# Patient Record
Sex: Female | Born: 1945 | Race: White | Hispanic: No | Marital: Married | Smoking: Never smoker
Health system: Southern US, Community
[De-identification: ages and names within clinical notes are randomized; demographics above are authoritative.]

## PROBLEM LIST (undated history)

## (undated) DIAGNOSIS — J302 Other seasonal allergic rhinitis: Secondary | ICD-10-CM

## (undated) DIAGNOSIS — E785 Hyperlipidemia, unspecified: Secondary | ICD-10-CM

## (undated) HISTORY — DX: Hyperlipidemia, unspecified: E78.5

## (undated) HISTORY — PX: TUBAL LIGATION: SHX77

## (undated) HISTORY — DX: Other seasonal allergic rhinitis: J30.2

---

## 1999-10-30 HISTORY — PX: CHOLECYSTECTOMY: SHX55

## 1999-12-27 ENCOUNTER — Encounter: Payer: Self-pay | Admitting: General Surgery

## 1999-12-29 ENCOUNTER — Observation Stay (HOSPITAL_COMMUNITY): Admission: RE | Admit: 1999-12-29 | Discharge: 1999-12-30 | Payer: Self-pay | Admitting: General Surgery

## 2011-11-30 DIAGNOSIS — R110 Nausea: Secondary | ICD-10-CM | POA: Diagnosis not present

## 2011-11-30 DIAGNOSIS — Z7982 Long term (current) use of aspirin: Secondary | ICD-10-CM | POA: Diagnosis not present

## 2011-11-30 DIAGNOSIS — Z23 Encounter for immunization: Secondary | ICD-10-CM | POA: Diagnosis not present

## 2011-11-30 DIAGNOSIS — K5730 Diverticulosis of large intestine without perforation or abscess without bleeding: Secondary | ICD-10-CM | POA: Diagnosis not present

## 2011-11-30 DIAGNOSIS — K56609 Unspecified intestinal obstruction, unspecified as to partial versus complete obstruction: Secondary | ICD-10-CM | POA: Diagnosis not present

## 2011-11-30 DIAGNOSIS — E7800 Pure hypercholesterolemia, unspecified: Secondary | ICD-10-CM | POA: Diagnosis not present

## 2011-11-30 DIAGNOSIS — K560 Paralytic ileus: Secondary | ICD-10-CM | POA: Diagnosis not present

## 2011-11-30 DIAGNOSIS — R1013 Epigastric pain: Secondary | ICD-10-CM | POA: Diagnosis not present

## 2011-11-30 DIAGNOSIS — I10 Essential (primary) hypertension: Secondary | ICD-10-CM | POA: Diagnosis not present

## 2011-11-30 DIAGNOSIS — K449 Diaphragmatic hernia without obstruction or gangrene: Secondary | ICD-10-CM | POA: Diagnosis not present

## 2011-12-01 DIAGNOSIS — K5730 Diverticulosis of large intestine without perforation or abscess without bleeding: Secondary | ICD-10-CM | POA: Diagnosis present

## 2011-12-01 DIAGNOSIS — Z7982 Long term (current) use of aspirin: Secondary | ICD-10-CM | POA: Diagnosis not present

## 2011-12-01 DIAGNOSIS — K560 Paralytic ileus: Secondary | ICD-10-CM | POA: Diagnosis not present

## 2011-12-01 DIAGNOSIS — Z23 Encounter for immunization: Secondary | ICD-10-CM | POA: Diagnosis not present

## 2011-12-01 DIAGNOSIS — K56609 Unspecified intestinal obstruction, unspecified as to partial versus complete obstruction: Secondary | ICD-10-CM | POA: Diagnosis not present

## 2011-12-01 DIAGNOSIS — R110 Nausea: Secondary | ICD-10-CM | POA: Diagnosis not present

## 2011-12-01 DIAGNOSIS — I10 Essential (primary) hypertension: Secondary | ICD-10-CM | POA: Diagnosis present

## 2011-12-01 DIAGNOSIS — K449 Diaphragmatic hernia without obstruction or gangrene: Secondary | ICD-10-CM | POA: Diagnosis present

## 2011-12-01 DIAGNOSIS — E7800 Pure hypercholesterolemia, unspecified: Secondary | ICD-10-CM | POA: Diagnosis present

## 2011-12-01 DIAGNOSIS — R1013 Epigastric pain: Secondary | ICD-10-CM | POA: Diagnosis not present

## 2012-01-23 DIAGNOSIS — Z0000 Encounter for general adult medical examination without abnormal findings: Secondary | ICD-10-CM | POA: Diagnosis not present

## 2012-01-23 DIAGNOSIS — Z01419 Encounter for gynecological examination (general) (routine) without abnormal findings: Secondary | ICD-10-CM | POA: Diagnosis not present

## 2012-02-05 DIAGNOSIS — Z1231 Encounter for screening mammogram for malignant neoplasm of breast: Secondary | ICD-10-CM | POA: Diagnosis not present

## 2012-03-06 DIAGNOSIS — G479 Sleep disorder, unspecified: Secondary | ICD-10-CM | POA: Diagnosis not present

## 2012-03-06 DIAGNOSIS — M1710 Unilateral primary osteoarthritis, unspecified knee: Secondary | ICD-10-CM | POA: Diagnosis not present

## 2012-03-18 DIAGNOSIS — E782 Mixed hyperlipidemia: Secondary | ICD-10-CM | POA: Diagnosis not present

## 2012-03-18 DIAGNOSIS — I10 Essential (primary) hypertension: Secondary | ICD-10-CM | POA: Diagnosis not present

## 2012-05-12 DIAGNOSIS — J019 Acute sinusitis, unspecified: Secondary | ICD-10-CM | POA: Diagnosis not present

## 2012-09-22 DIAGNOSIS — E559 Vitamin D deficiency, unspecified: Secondary | ICD-10-CM | POA: Diagnosis not present

## 2012-09-22 DIAGNOSIS — I10 Essential (primary) hypertension: Secondary | ICD-10-CM | POA: Diagnosis not present

## 2012-09-22 DIAGNOSIS — E782 Mixed hyperlipidemia: Secondary | ICD-10-CM | POA: Diagnosis not present

## 2012-10-08 DIAGNOSIS — E559 Vitamin D deficiency, unspecified: Secondary | ICD-10-CM | POA: Diagnosis not present

## 2012-10-08 DIAGNOSIS — E782 Mixed hyperlipidemia: Secondary | ICD-10-CM | POA: Diagnosis not present

## 2012-10-27 DIAGNOSIS — D1801 Hemangioma of skin and subcutaneous tissue: Secondary | ICD-10-CM | POA: Diagnosis not present

## 2012-10-27 DIAGNOSIS — L820 Inflamed seborrheic keratosis: Secondary | ICD-10-CM | POA: Diagnosis not present

## 2012-10-27 DIAGNOSIS — L821 Other seborrheic keratosis: Secondary | ICD-10-CM | POA: Diagnosis not present

## 2012-10-27 DIAGNOSIS — L570 Actinic keratosis: Secondary | ICD-10-CM | POA: Diagnosis not present

## 2012-11-06 DIAGNOSIS — R0989 Other specified symptoms and signs involving the circulatory and respiratory systems: Secondary | ICD-10-CM | POA: Diagnosis not present

## 2012-11-06 DIAGNOSIS — J069 Acute upper respiratory infection, unspecified: Secondary | ICD-10-CM | POA: Diagnosis not present

## 2012-11-06 DIAGNOSIS — J209 Acute bronchitis, unspecified: Secondary | ICD-10-CM | POA: Diagnosis not present

## 2012-12-15 DIAGNOSIS — H43399 Other vitreous opacities, unspecified eye: Secondary | ICD-10-CM | POA: Diagnosis not present

## 2012-12-15 DIAGNOSIS — H52229 Regular astigmatism, unspecified eye: Secondary | ICD-10-CM | POA: Diagnosis not present

## 2012-12-15 DIAGNOSIS — H2589 Other age-related cataract: Secondary | ICD-10-CM | POA: Diagnosis not present

## 2012-12-15 DIAGNOSIS — H5200 Hypermetropia, unspecified eye: Secondary | ICD-10-CM | POA: Diagnosis not present

## 2013-03-17 DIAGNOSIS — K219 Gastro-esophageal reflux disease without esophagitis: Secondary | ICD-10-CM | POA: Diagnosis not present

## 2013-03-17 DIAGNOSIS — I10 Essential (primary) hypertension: Secondary | ICD-10-CM | POA: Diagnosis not present

## 2013-03-17 DIAGNOSIS — E782 Mixed hyperlipidemia: Secondary | ICD-10-CM | POA: Diagnosis not present

## 2013-03-28 DIAGNOSIS — Z7982 Long term (current) use of aspirin: Secondary | ICD-10-CM | POA: Diagnosis not present

## 2013-03-28 DIAGNOSIS — R1084 Generalized abdominal pain: Secondary | ICD-10-CM | POA: Diagnosis not present

## 2013-03-28 DIAGNOSIS — I10 Essential (primary) hypertension: Secondary | ICD-10-CM | POA: Diagnosis not present

## 2013-03-28 DIAGNOSIS — R05 Cough: Secondary | ICD-10-CM | POA: Diagnosis not present

## 2013-03-28 DIAGNOSIS — R112 Nausea with vomiting, unspecified: Secondary | ICD-10-CM | POA: Diagnosis not present

## 2013-03-28 DIAGNOSIS — K56609 Unspecified intestinal obstruction, unspecified as to partial versus complete obstruction: Secondary | ICD-10-CM | POA: Diagnosis not present

## 2013-03-28 DIAGNOSIS — E7800 Pure hypercholesterolemia, unspecified: Secondary | ICD-10-CM | POA: Diagnosis not present

## 2013-03-28 DIAGNOSIS — K449 Diaphragmatic hernia without obstruction or gangrene: Secondary | ICD-10-CM | POA: Diagnosis not present

## 2013-03-28 DIAGNOSIS — R059 Cough, unspecified: Secondary | ICD-10-CM | POA: Diagnosis not present

## 2013-03-28 DIAGNOSIS — Z79899 Other long term (current) drug therapy: Secondary | ICD-10-CM | POA: Diagnosis not present

## 2013-04-08 DIAGNOSIS — J0100 Acute maxillary sinusitis, unspecified: Secondary | ICD-10-CM | POA: Diagnosis not present

## 2013-04-08 DIAGNOSIS — J209 Acute bronchitis, unspecified: Secondary | ICD-10-CM | POA: Diagnosis not present

## 2013-04-14 DIAGNOSIS — K56609 Unspecified intestinal obstruction, unspecified as to partial versus complete obstruction: Secondary | ICD-10-CM | POA: Diagnosis not present

## 2013-04-15 DIAGNOSIS — Z1211 Encounter for screening for malignant neoplasm of colon: Secondary | ICD-10-CM | POA: Diagnosis not present

## 2013-04-15 DIAGNOSIS — Z1239 Encounter for other screening for malignant neoplasm of breast: Secondary | ICD-10-CM | POA: Diagnosis not present

## 2013-04-15 DIAGNOSIS — Z1331 Encounter for screening for depression: Secondary | ICD-10-CM | POA: Diagnosis not present

## 2013-04-15 DIAGNOSIS — Z0000 Encounter for general adult medical examination without abnormal findings: Secondary | ICD-10-CM | POA: Diagnosis not present

## 2013-04-16 DIAGNOSIS — K219 Gastro-esophageal reflux disease without esophagitis: Secondary | ICD-10-CM | POA: Diagnosis not present

## 2013-04-16 DIAGNOSIS — R112 Nausea with vomiting, unspecified: Secondary | ICD-10-CM | POA: Diagnosis not present

## 2013-04-23 DIAGNOSIS — Z1231 Encounter for screening mammogram for malignant neoplasm of breast: Secondary | ICD-10-CM | POA: Diagnosis not present

## 2013-05-04 DIAGNOSIS — N816 Rectocele: Secondary | ICD-10-CM | POA: Diagnosis not present

## 2013-06-08 DIAGNOSIS — N816 Rectocele: Secondary | ICD-10-CM | POA: Diagnosis not present

## 2013-08-07 DIAGNOSIS — Z23 Encounter for immunization: Secondary | ICD-10-CM | POA: Diagnosis not present

## 2013-09-23 DIAGNOSIS — J069 Acute upper respiratory infection, unspecified: Secondary | ICD-10-CM | POA: Diagnosis not present

## 2013-10-14 DIAGNOSIS — J209 Acute bronchitis, unspecified: Secondary | ICD-10-CM | POA: Diagnosis not present

## 2013-10-14 DIAGNOSIS — J040 Acute laryngitis: Secondary | ICD-10-CM | POA: Diagnosis not present

## 2013-10-16 ENCOUNTER — Encounter: Payer: Self-pay | Admitting: Internal Medicine

## 2013-10-16 ENCOUNTER — Ambulatory Visit (INDEPENDENT_AMBULATORY_CARE_PROVIDER_SITE_OTHER): Payer: Medicare Other | Admitting: Internal Medicine

## 2013-10-16 VITALS — BP 94/60 | HR 90 | Temp 98.5°F | Ht 65.0 in | Wt 170.8 lb

## 2013-10-16 DIAGNOSIS — J45909 Unspecified asthma, uncomplicated: Secondary | ICD-10-CM

## 2013-10-16 DIAGNOSIS — K219 Gastro-esophageal reflux disease without esophagitis: Secondary | ICD-10-CM | POA: Diagnosis not present

## 2013-10-16 MED ORDER — TRAMADOL HCL 50 MG PO TABS: 50.0000 mg | ORAL_TABLET | Freq: Four times a day (QID) | ORAL | Status: DC | PRN

## 2013-10-16 MED ORDER — BUDESONIDE-FORMOTEROL FUMARATE 80-4.5 MCG/ACT IN AERO: INHALATION_SPRAY | RESPIRATORY_TRACT | Status: DC

## 2013-10-16 NOTE — Patient Instructions (Addendum)
symbicort 80 Take 2 puffs first thing in am and then another 2 puffs about 12 hours later  Work on inhaler technique:  relax and gently blow all the way out then take a nice smooth deep breath back in, triggering the inhaler at same time you start breathing in.  Hold for up to 5 seconds if you can.  Rinse and gargle with water when done      Finish zpak  For cough mucinex dm up to 1200 mg every 12 hours and if not controlled ok to take tramadol 50 mg one every 4 but may make you sleepy  Nexium 40 mg   Take 30-60 min before first meal of the day and Pepcid 20 mg one bedtime until return to office - this is the best way to tell whether stomach acid is contributing to your problem.    GERD (REFLUX)  is an extremely common cause of respiratory symptoms, many times with no significant heartburn at all.    It can be treated with medication, but also with lifestyle changes including avoidance of late meals, excessive alcohol, smoking cessation, and avoid fatty foods, chocolate, peppermint, colas, red wine, and acidic juices such as orange juice.  NO MINT OR MENTHOL PRODUCTS SO NO COUGH DROPS  USE SUGARLESS CANDY INSTEAD (jolley ranchers or Stover's)  NO OIL BASED VITAMINS - use powdered substitutes.    Please schedule a follow up office visit in 4 weeks, sooner if needed

## 2013-10-16 NOTE — Progress Notes (Signed)
   Subjective:    Patient ID: Desiree Greene, female    DOB: 03/06/1946  MRN: [id number]  HPI  26 yowf never smoker with chronic reflux symptoms onset age mid 39s and tendency to sinus problems problems in spring >  fall with sneezing and nasal congestion onset late 4s rx with otc's like benadryl then new problem with cough Jan 2014 assoc with  sob rx pred/abx > resolved  then same problem later summer and then again Nov 2014 when had the worst episode and so self referred to pulmonary 10/16/2013 for refractory cough > sob recurrent in 09/2013    10/16/2013 1st [facility] office visit/ Brena Windsor cc recurrent cough x 1 year with 4th episode starting 12/15 > seen first care 12/17 dx bronchitis rx zpak, no prednisone for the first time, has inhaler from prev flare not using.  Mucus is always white. Pattern of flare has been increasing freq, intensity,but still 100% resolved in between episodes s need for rx. Does not some worse HB with coughing.  Mostly sob when coughing but even when not coughing has some doe  No obvious day to day or daytime variabilty or assoc   cp or chest tightness, subjective wheeze overt sinus or hb symptoms. No unusual exp hx or h/o childhood pna/ asthma or knowledge of premature birth.  Sleeping ok without nocturnal  or early am exacerbation  of respiratory  c/o's or need for noct saba. Also denies any obvious fluctuation of symptoms with weather or environmental changes or other aggravating or alleviating factors except as outlined above   Current Medications, Allergies, Complete Past Medical History, Past Surgical History, Family History, and Social History were reviewed in [employer] record.   .         Review of Systems  Constitutional: Negative for fever, chills and unexpected weight change.  HENT: Positive for congestion and sneezing. Negative for dental problem, ear pain, nosebleeds, postnasal drip, rhinorrhea, sinus pressure, sore  throat, trouble swallowing and voice change.   Eyes: Negative for visual disturbance.  Respiratory: Positive for cough. Negative for choking and shortness of breath.   Cardiovascular: Negative for chest pain and leg swelling.  Gastrointestinal: Negative for vomiting, abdominal pain and diarrhea.  Genitourinary: Negative for difficulty urinating.       Heartburn Indigestion  Musculoskeletal: Positive for arthralgias.  Skin: Negative for rash.  Neurological: Negative for tremors, syncope and headaches.  Hematological: Does not bruise/bleed easily.       Objective:   Physical Exam amb wf nad Wt Readings from Last 3 Encounters:  10/16/13 170 lb 12.8 oz (77.474 kg)      HEENT: nl dentition, turbinates, and orophanx. Nl external ear canals without cough reflex   NECK :  without JVD/Nodes/TM/ nl carotid upstrokes bilaterally   LUNGS: no acc muscle use,  Exp > insp prominent rhonchi bilaterally with exp cough   CV:  RRR  no s3 or murmur or increase in P2, no edema   ABD:  soft and nontender with nl excursion in the supine position. No bruits or organomegaly, bowel sounds nl  MS:  warm without deformities, calf tenderness, cyanosis or clubbing  SKIN: warm and dry without lesions    NEURO:  alert, approp, no deficits         Assessment & Plan:

## 2013-10-17 DIAGNOSIS — K219 Gastro-esophageal reflux disease without esophagitis: Secondary | ICD-10-CM | POA: Insufficient documentation

## 2013-10-17 NOTE — Assessment & Plan Note (Signed)
Explained natural history of uri (which may be the problem here though the frequency of her cough exac suggests this may not always be the case)  and why it's necessary in patients at risk to treat GERD aggressively  at least  short term   to reduce risk of evolving cyclical cough initially  triggered by epithelial injury and a heightened sensitivty to the effects of any upper airway irritants,  most importantly acid - related.  That is, the more sensitive the epithelium damaged for virus, the more the cough, the more the secondary reflux (especially in those prone to reflux) the more the irritation of the sensitive mucosa and so on in a cyclical pattern.

## 2013-10-17 NOTE — Assessment & Plan Note (Addendum)
DDX of  difficult airways managment all start with A and  include Adherence, Ace Inhibitors, Acid Reflux, Active Sinus Disease, Alpha 1 Antitripsin deficiency, Anxiety masquerading as Airways dz,  ABPA,  allergy(esp in young), Aspiration (esp in elderly), Adverse effects of DPI,  Active smokers, plus two Bs  = Bronchiectasis and Beta blocker use..and one C= CHF  Adherence is always the initial "prime suspect" and is a multilayered concern that requires a "trust but verify" approach in every patient - starting with knowing how to use medications, especially inhalers, correctly, keeping up with refills and understanding the fundamental difference between maintenance and prns vs those medications only taken for a very short course and then stopped and not refilled.  - The proper method of use, as well as anticipated side effects, of a metered-dose inhaler are discussed and demonstrated to the patient. Improved effectiveness after extensive coaching during this visit to a level of approximately  50% so try symbicort 80 2 bid plus depomedrol x 1  ? Acid (or non-acid) GERD > always difficult to exclude as up to 75% of pts in some series report no assoc GI/ Heartburn symptoms and those that do (like this pt) have close to 100% incidence of documented gerd related asthma > rec max (24h)  acid suppression and diet restrictions/ reviewed and instructions given in writing.  ? Active sinus dz > sinus ct if not responding.

## 2013-11-04 DIAGNOSIS — L578 Other skin changes due to chronic exposure to nonionizing radiation: Secondary | ICD-10-CM | POA: Diagnosis not present

## 2013-11-04 DIAGNOSIS — L821 Other seborrheic keratosis: Secondary | ICD-10-CM | POA: Diagnosis not present

## 2013-11-04 DIAGNOSIS — L820 Inflamed seborrheic keratosis: Secondary | ICD-10-CM | POA: Diagnosis not present

## 2013-11-04 DIAGNOSIS — D1801 Hemangioma of skin and subcutaneous tissue: Secondary | ICD-10-CM | POA: Diagnosis not present

## 2013-11-19 ENCOUNTER — Other Ambulatory Visit (INDEPENDENT_AMBULATORY_CARE_PROVIDER_SITE_OTHER): Payer: Medicare Other

## 2013-11-19 ENCOUNTER — Ambulatory Visit (INDEPENDENT_AMBULATORY_CARE_PROVIDER_SITE_OTHER): Payer: Medicare Other | Admitting: Internal Medicine

## 2013-11-19 ENCOUNTER — Encounter: Payer: Self-pay | Admitting: Internal Medicine

## 2013-11-19 VITALS — BP 106/66 | HR 65 | Temp 97.6°F | Ht 66.0 in | Wt 175.0 lb

## 2013-11-19 DIAGNOSIS — K219 Gastro-esophageal reflux disease without esophagitis: Secondary | ICD-10-CM | POA: Diagnosis not present

## 2013-11-19 DIAGNOSIS — J45909 Unspecified asthma, uncomplicated: Secondary | ICD-10-CM

## 2013-11-19 LAB — CBC WITH DIFFERENTIAL/PLATELET
BASOS PCT: 0.5 % (ref 0.0–3.0)
Basophils Absolute: 0 10*3/uL (ref 0.0–0.1)
Eosinophils Absolute: 0.5 10*3/uL (ref 0.0–0.7)
Eosinophils Relative: 5.3 % — ABNORMAL HIGH (ref 0.0–5.0)
HEMATOCRIT: 38.7 % (ref 36.0–46.0)
Hemoglobin: 13.3 g/dL (ref 12.0–15.0)
LYMPHS ABS: 2.9 10*3/uL (ref 0.7–4.0)
Lymphocytes Relative: 28.5 % (ref 12.0–46.0)
MCHC: 34.4 g/dL (ref 30.0–36.0)
MCV: 92.4 fl (ref 78.0–100.0)
MONO ABS: 0.4 10*3/uL (ref 0.1–1.0)
MONOS PCT: 4.2 % (ref 3.0–12.0)
Neutro Abs: 6.2 10*3/uL (ref 1.4–7.7)
Neutrophils Relative %: 61.5 % (ref 43.0–77.0)
PLATELETS: 292 10*3/uL (ref 150.0–400.0)
RBC: 4.18 Mil/uL (ref 3.87–5.11)
RDW: 12.8 % (ref 11.5–14.6)
WBC: 10.2 10*3/uL (ref 4.5–10.5)

## 2013-11-19 NOTE — Patient Instructions (Addendum)
Change the symbicort 160 1- 2 every 12 if breathing problems  Please see patient coordinator before you leave today  to schedule sinus ct  Please remember to go to the lab   department downstairs for your tests - we will call you with the results when they are available.  Please schedule a follow up office visit in 4 weeks, sooner if needed

## 2013-11-19 NOTE — Progress Notes (Signed)
Subjective:    Patient ID: Desiree Greene, female    DOB: [birth year]  MRN: [id number]  Brief patient profile:  78 yowf never smoker with chronic reflux symptoms onset age mid 19s and tendency to sinus problems problems in spring >  fall with sneezing and nasal congestion onset late 17s rx with otc's like benadryl then new problem with cough Jan 2014 assoc with  sob rx pred/abx > resolved  then same problem later summer and then again Nov 2014 when had the worst episode and so self referred to pulmonary 10/16/2013 for refractory cough > sob recurrent in 09/2013    History of Present Illness  10/16/2013 1st [facility] office visit/ Crystall Donaldson cc recurrent cough x 1 year with 4th episode starting 12/15 > seen [facility] 12/17 dx bronchitis rx zpak, no prednisone for the first time, has inhaler from prev flare not using.  Mucus is always white. Pattern of flare has been increasing freq, intensity,but still 100% resolved in between episodes s need for rx. Does not some worse HB with coughing.  Mostly sob when coughing but even when not coughing has some doe rec symbicort 80 Take 2 puffs first thing in am and then another 2 puffs about 12 hours later Work on inhaler technique:  Finish zpak For cough mucinex dm up to 1200 mg every 12 hours and if not controlled ok to take tramadol 50 mg one every 4 but may make you sleepy Nexium 40 mg   Take 30-60 min before first meal of the day and Pepcid 20 mg one bedtime until return to office - this is the best way to tell whether stomach acid is contributing to your problem.   GERD  Diet .      11/19/2013 f/u ov/Estoria Geary re: chronic cough  Chief Complaint  Patient presents with  . Follow-up    Pt has no breathing complaints at this time.      She can definitely tell when she misses a dose of symbicort but other wise doing well.  Min hoarsness since starting symbicort  No obvious day to day or daytime variabilty or assoc chronic cough or cp or chest tightness,  subjective wheeze overt sinus or hb symptoms. No unusual exp hx or h/o childhood pna/ asthma or knowledge of premature birth.  Sleeping ok without nocturnal  or early am exacerbation  of respiratory  c/o's or need for noct saba. Also denies any obvious fluctuation of symptoms with weather or environmental changes or other aggravating or alleviating factors except as outlined above   Current Medications, Allergies, Complete Past Medical History, Past Surgical History, Family History, and Social History were reviewed in [employer] record.  ROS  The following are not active complaints unless bolded sore throat, dysphagia, dental problems, itching, sneezing,  nasal congestion or excess/ purulent secretions, ear ache,   fever, chills, sweats, unintended wt loss, pleuritic or exertional cp, hemoptysis,  orthopnea pnd or leg swelling, presyncope, palpitations, heartburn, abdominal pain, anorexia, nausea, vomiting, diarrhea  or change in bowel or urinary habits, change in stools or urine, dysuria,hematuria,  rash, arthralgias, visual complaints, headache, numbness weakness or ataxia or problems with walking or coordination,  change in mood/affect or memory.        .              Objective:   Physical Exam   amb wf nad  Wt Readings from Last 3 Encounters:  11/19/13 175 lb (79.379 kg)  10/16/13 170 lb 12.8 oz (77.474 kg)         HEENT: nl dentition, turbinates, and orophanx. Nl external ear canals without cough reflex   NECK :  without JVD/Nodes/TM/ nl carotid upstrokes bilaterally   LUNGS: no acc muscle use,  Clear now to a and P   CV:  RRR  no s3 or murmur or increase in P2, no edema   ABD:  soft and nontender with nl excursion in the supine position. No bruits or organomegaly, bowel sounds nl  MS:  warm without deformities, calf tenderness, cyanosis or clubbing  SKIN: warm and dry without lesions    NEURO:  alert, approp, no deficits          Assessment & Plan:

## 2013-11-20 ENCOUNTER — Encounter: Payer: Self-pay | Admitting: Internal Medicine

## 2013-11-20 LAB — ALLERGY PROFILE REGION II-DC, DE, MD, ~~LOC~~, VA
ALLERGEN, D PTERNOYSSINUS, D1: 9.93 kU/L — AB
Alternaria Alternata: <0.1 kU/L
Aspergillus fumigatus, m3: <0.1 kU/L
BERMUDA GRASS: 1.46 kU/L — AB
BOX ELDER: 0.34 kU/L — AB
COCKROACH: 0.81 kU/L — AB
COMMON RAGWEED: 0.47 kU/L — AB
Cat Dander: <0.1 kU/L
D. farinae: 17.9 kU/L — ABNORMAL HIGH
DOG DANDER: 0.28 kU/L — AB
Elm IgE: 0.38 kU/L — ABNORMAL HIGH
IgE (Immunoglobulin E), Serum: 794.5 IU/mL — ABNORMAL HIGH (ref 0.0–180.0)
Johnson Grass: 0.67 kU/L — ABNORMAL HIGH
LAMB'S QUARTERS CLASS: 0.74 kU/L — AB
Meadow Grass: 0.78 kU/L — ABNORMAL HIGH
Oak: 0.54 kU/L — ABNORMAL HIGH
PECAN/HICKORY TREE IGE: 0.22 kU/L — AB

## 2013-11-20 NOTE — Assessment & Plan Note (Signed)
Adequate control on present rx, reviewed > no change in rx needed   

## 2013-11-20 NOTE — Assessment & Plan Note (Addendum)
-    hfa 50% 10/16/2013   - Allergy profile 11/19/2013 >  Eos 5.3% ,  IG=gE 795  The proper method of use, as well as anticipated side effects, of a metered-dose inhaler are discussed and demonstrated to the patient. Improved effectiveness after extensive coaching during this visit to a level of approximately  75%  Since we don't have any samples of the 80 and she's mastering hfa better ok to use the 160 1-2 bid and return in one month to regroup re issue of allergic component    Each maintenance medication was reviewed in detail including most importantly the difference between maintenance and as needed and under what circumstances the prns are to be used.  Please see instructions for details which were reviewed in writing and the patient given a copy.

## 2013-11-21 ENCOUNTER — Encounter: Payer: Self-pay | Admitting: Internal Medicine

## 2013-11-23 NOTE — Progress Notes (Signed)
Quick Note:  Spoke with pt and notified of results per Dr. Wert. Pt verbalized understanding and denied any questions.  ______ 

## 2013-11-25 ENCOUNTER — Ambulatory Visit (INDEPENDENT_AMBULATORY_CARE_PROVIDER_SITE_OTHER)
Admission: RE | Admit: 2013-11-25 | Discharge: 2013-11-25 | Disposition: A | Discharge status: Home / Self Care | Payer: Medicare Other | Source: Ambulatory Visit | Attending: Internal Medicine | Admitting: Internal Medicine

## 2013-11-25 ENCOUNTER — Encounter: Payer: Self-pay | Admitting: Internal Medicine

## 2013-11-25 DIAGNOSIS — J45909 Unspecified asthma, uncomplicated: Secondary | ICD-10-CM | POA: Diagnosis not present

## 2013-11-25 DIAGNOSIS — J3489 Other specified disorders of nose and nasal sinuses: Secondary | ICD-10-CM | POA: Diagnosis not present

## 2013-11-25 NOTE — Progress Notes (Signed)
Quick Note:  LMTCB ______ 

## 2013-11-26 ENCOUNTER — Telehealth: Payer: Self-pay | Admitting: Internal Medicine

## 2013-11-26 MED ORDER — AMOXICILLIN-POT CLAVULANATE 875-125 MG PO TABS: 1.0000 | ORAL_TABLET | Freq: Two times a day (BID) | ORAL | Status: DC

## 2013-11-26 NOTE — Telephone Encounter (Signed)
Call patient : Study is c/w acute and chronic sinusitis may be contributing to symptoms and only way to tell = Augmentin 875 mg take one pill twice daily X 20 days - take at breakfast and supper with large glass of water. It would help reduce the usual side effects (diarrhea and yeast infections) if you ate cultured yogurt at lunch. Then ov at 21 d   I spoke with patient about results and she verbalized understanding and had no questions. RX sent. Nothing further needed

## 2013-12-18 ENCOUNTER — Ambulatory Visit: Payer: Medicare Other | Admitting: Internal Medicine

## 2013-12-22 ENCOUNTER — Ambulatory Visit: Payer: Medicare Other | Admitting: Internal Medicine

## 2013-12-29 ENCOUNTER — Ambulatory Visit (INDEPENDENT_AMBULATORY_CARE_PROVIDER_SITE_OTHER): Payer: Medicare Other | Admitting: Internal Medicine

## 2013-12-29 ENCOUNTER — Encounter: Payer: Self-pay | Admitting: Internal Medicine

## 2013-12-29 VITALS — BP 138/80 | HR 71 | Temp 97.5°F | Ht 66.0 in | Wt 181.6 lb

## 2013-12-29 DIAGNOSIS — J019 Acute sinusitis, unspecified: Secondary | ICD-10-CM | POA: Diagnosis not present

## 2013-12-29 DIAGNOSIS — J309 Allergic rhinitis, unspecified: Secondary | ICD-10-CM | POA: Diagnosis not present

## 2013-12-29 DIAGNOSIS — J45909 Unspecified asthma, uncomplicated: Secondary | ICD-10-CM

## 2013-12-29 DIAGNOSIS — J329 Chronic sinusitis, unspecified: Secondary | ICD-10-CM | POA: Diagnosis not present

## 2013-12-29 DIAGNOSIS — J342 Deviated nasal septum: Secondary | ICD-10-CM | POA: Diagnosis not present

## 2013-12-29 MED ORDER — PREDNISONE 10 MG PO TABS: ORAL_TABLET | ORAL | Status: DC

## 2013-12-29 MED ORDER — MONTELUKAST SODIUM 10 MG PO TABS: ORAL_TABLET | ORAL | Status: DC

## 2013-12-29 MED ORDER — FAMOTIDINE 20 MG PO TABS: 20.0000 mg | ORAL_TABLET | Freq: Every day | ORAL | Status: DC

## 2013-12-29 MED ORDER — ESOMEPRAZOLE MAGNESIUM 40 MG PO CPDR: DELAYED_RELEASE_CAPSULE | ORAL | Status: DC

## 2013-12-29 MED ORDER — AMOXICILLIN-POT CLAVULANATE 875-125 MG PO TABS: 1.0000 | ORAL_TABLET | Freq: Two times a day (BID) | ORAL | Status: DC

## 2013-12-29 NOTE — Patient Instructions (Addendum)
Montelukast 10 mg one each pm   Please see patient coordinator before you leave today  to schedule ENT eval with Dr Melina Modena and  Allergy Kozlow   Stop zyrtec  For cough mucinex dm up to 1200 mg every 12 hours if needed   For breathing  symbicort 160 up to 2 puffs every 12 hours if needed  If condition worsens while waiting to see your [facility] docs,  Short course of prednisone and augmentin can be filled

## 2013-12-29 NOTE — Progress Notes (Signed)
Subjective:    Patient ID: Desiree Greene, female    DOB: 1946/08/24  MRN: [id number]  Brief patient profile:  53 yowf never smoker with chronic reflux symptoms onset age mid 60s and tendency to sinus problems problems in spring >  fall with sneezing and nasal congestion onset late 9s rx with otc's like benadryl then new problem with cough Jan 2014 assoc with  sob rx pred/abx > resolved  then same problem later summer and then again Nov 2014 when had the worst episode and so self referred to pulmonary 10/16/2013 for refractory cough > sob recurrent in 09/2013    History of Present Illness  10/16/2013 1st [facility] office visit/ [name] cc recurrent cough x 1 year with 4th episode starting 12/15 > seen first care 12/17 dx bronchitis rx zpak, no prednisone for the first time, has inhaler from prev flare not using.  Mucus is always white. Pattern of flare has been increasing freq, intensity,but still 100% resolved in between episodes s need for rx. Does note some worse HB with coughing.  Mostly sob when coughing but even when not coughing has some doe rec symbicort 80 Take 2 puffs first thing in am and then another 2 puffs about 12 hours later Work on inhaler technique:  Finish zpak For cough mucinex dm up to 1200 mg every 12 hours and if not controlled ok to take tramadol 50 mg one every 4 but may make you sleepy Nexium 40 mg   Take 30-60 min before first meal of the day and Pepcid 20 mg one bedtime until return to office - this is the best way to tell whether stomach acid is contributing to your problem.   GERD  Diet      11/19/2013 f/u ov/Maclovia Uher re: chronic cough  Chief Complaint  Patient presents with  . Follow-up    Pt has no breathing complaints at this time.   She can definitely tell when she misses a dose of symbicort but other wise doing well.  Min hoarsness since starting symbicort rec Change the symbicort 160 1- 2 every 12 if breathing problems > no change in breathing off  symbicort Please see patient coordinator before you leave today  to schedule sinus ct> sinusitis > augmentin x 20 d     12/29/2013 f/u ov/[name] re: cough  Chief Complaint  Patient presents with  . Follow-up    Cough has improved--still some white mucus with cough.  Reports has acid reflux and gastritis   Not limited by breathing from desired activities    No obvious day to day or daytime variabilty or assoc chronic cough or cp or chest tightness, subjective wheeze overt sinus  symptoms. No unusual exp hx or h/o childhood pna/ asthma or knowledge of premature birth.  Sleeping ok without nocturnal  or early am exacerbation  of respiratory  c/o's or need for noct saba. Also denies any obvious fluctuation of symptoms with weather or environmental changes or other aggravating or alleviating factors except as outlined above   Current Medications, Allergies, Complete Past Medical History, Past Surgical History, Family History, and Social History were reviewed in [employer] record.  ROS  The following are not active complaints unless bolded sore throat, dysphagia, dental problems, itching, sneezing,  nasal congestion or excess/ purulent secretions, ear ache,   fever, chills, sweats, unintended wt loss, pleuritic or exertional cp, hemoptysis,  orthopnea pnd or leg swelling, presyncope, palpitations, heartburn, abdominal pain, anorexia, nausea, vomiting, diarrhea  or change in bowel or urinary habits, change in stools or urine, dysuria,hematuria,  rash, arthralgias, visual complaints, headache, numbness weakness or ataxia or problems with walking or coordination,  change in mood/affect or memory.        .              Objective:   Physical Exam   amb wf nad  Wt Readings from Last 3 Encounters:  12/29/13 181 lb 9.6 oz (82.373 kg)  11/19/13 175 lb (79.379 kg)  10/16/13 170 lb 12.8 oz (77.474 kg)            HEENT: nl dentition, turbinates, and orophanx. Nl external  ear canals without cough reflex   NECK :  without JVD/Nodes/TM/ nl carotid upstrokes bilaterally   LUNGS: no acc muscle use,  Clear now to a and P   CV:  RRR  no s3 or murmur or increase in P2, no edema   ABD:  soft and nontender with nl excursion in the supine position. No bruits or organomegaly, bowel sounds nl  MS:  warm without deformities, calf tenderness, cyanosis or clubbing  SKIN: warm and dry without lesions           Assessment & Plan:

## 2013-12-31 NOTE — Assessment & Plan Note (Addendum)
-    hfa 50% 10/16/2013 > 75% 11/18/13  - Allergy profile 11/19/2013 >  Eos 5.3% ,  IG=gE 795 marked POS RAST, multiple aeroallergens - Sinus CT 11/25/2013 > Mild mucosal thickening and retained secretions within the sinuses as above. Possible fluid in the left maxillary sinus may indicate acute sinusitis > rx augmentin bid x 20 d then ov - added singulair 12/29/13  DDX of  difficult airways managment all start with A and  include Adherence, Ace Inhibitors, Acid Reflux, Active Sinus Disease, Alpha 1 Antitripsin deficiency, Anxiety masquerading as Airways dz,  ABPA,  allergy(esp in young), Aspiration (esp in elderly), Adverse effects of DPI,  Active smokers, plus two Bs  = Bronchiectasis and Beta blocker use..and one C= CHF  Adherence is always the initial "prime suspect" and is a multilayered concern that requires a "trust but verify" approach in every patient - starting with knowing how to use medications, especially inhalers, correctly, keeping up with refills and understanding the fundamental difference between maintenance and prns vs those medications only taken for a very short course and then stopped and not refilled.   Active sinus dz > ent eval > [place]   Allergy >  Added trial of singulair and referred to DR [facility] in [place]  ? Acid (or non-acid) GERD > always difficult to exclude as up to 75% of pts in some series report no assoc GI/ Heartburn symptoms> rec continue max (24h)  acid suppression and diet restrictions/ reviewed and instructions given in writing> GI f/u prn   I don't believe she'll actually need symbicort once we effectively address the above causes of airways instability but can certainly use it prn in meantime

## 2014-01-01 ENCOUNTER — Telehealth: Payer: Self-pay | Admitting: Internal Medicine

## 2014-01-01 MED ORDER — FAMOTIDINE 20 MG PO TABS: 20.0000 mg | ORAL_TABLET | Freq: Every day | ORAL | Status: DC

## 2014-01-01 NOTE — Telephone Encounter (Signed)
Rx has been resent to [facility] per the pt's request. Pt is aware. Nothing further is needed.

## 2014-01-07 DIAGNOSIS — J370 Chronic laryngitis: Secondary | ICD-10-CM | POA: Diagnosis not present

## 2014-01-07 DIAGNOSIS — J45909 Unspecified asthma, uncomplicated: Secondary | ICD-10-CM | POA: Diagnosis not present

## 2014-01-07 DIAGNOSIS — J309 Allergic rhinitis, unspecified: Secondary | ICD-10-CM | POA: Diagnosis not present

## 2014-01-25 ENCOUNTER — Other Ambulatory Visit: Payer: Self-pay | Admitting: Internal Medicine

## 2014-01-25 MED ORDER — MONTELUKAST SODIUM 10 MG PO TABS: ORAL_TABLET | ORAL | Status: DC

## 2014-01-28 DIAGNOSIS — J329 Chronic sinusitis, unspecified: Secondary | ICD-10-CM | POA: Diagnosis not present

## 2014-01-28 DIAGNOSIS — J342 Deviated nasal septum: Secondary | ICD-10-CM | POA: Diagnosis not present

## 2014-01-28 DIAGNOSIS — J309 Allergic rhinitis, unspecified: Secondary | ICD-10-CM | POA: Diagnosis not present

## 2014-02-04 DIAGNOSIS — J45909 Unspecified asthma, uncomplicated: Secondary | ICD-10-CM | POA: Diagnosis not present

## 2014-02-04 DIAGNOSIS — J309 Allergic rhinitis, unspecified: Secondary | ICD-10-CM | POA: Diagnosis not present

## 2014-04-02 DIAGNOSIS — C44519 Basal cell carcinoma of skin of other part of trunk: Secondary | ICD-10-CM | POA: Diagnosis not present

## 2014-04-02 DIAGNOSIS — L820 Inflamed seborrheic keratosis: Secondary | ICD-10-CM | POA: Diagnosis not present

## 2014-04-06 ENCOUNTER — Other Ambulatory Visit: Payer: Self-pay | Admitting: Internal Medicine

## 2014-04-13 DIAGNOSIS — H52229 Regular astigmatism, unspecified eye: Secondary | ICD-10-CM | POA: Diagnosis not present

## 2014-04-13 DIAGNOSIS — H5200 Hypermetropia, unspecified eye: Secondary | ICD-10-CM | POA: Diagnosis not present

## 2014-04-13 DIAGNOSIS — H2589 Other age-related cataract: Secondary | ICD-10-CM | POA: Diagnosis not present

## 2014-04-13 DIAGNOSIS — H43399 Other vitreous opacities, unspecified eye: Secondary | ICD-10-CM | POA: Diagnosis not present

## 2014-05-04 DIAGNOSIS — C44519 Basal cell carcinoma of skin of other part of trunk: Secondary | ICD-10-CM | POA: Diagnosis not present

## 2014-05-06 DIAGNOSIS — J45909 Unspecified asthma, uncomplicated: Secondary | ICD-10-CM | POA: Diagnosis not present

## 2014-05-06 DIAGNOSIS — J309 Allergic rhinitis, unspecified: Secondary | ICD-10-CM | POA: Diagnosis not present

## 2014-05-17 DIAGNOSIS — K2960 Other gastritis without bleeding: Secondary | ICD-10-CM | POA: Diagnosis not present

## 2014-05-17 DIAGNOSIS — I10 Essential (primary) hypertension: Secondary | ICD-10-CM | POA: Diagnosis not present

## 2014-05-17 DIAGNOSIS — N816 Rectocele: Secondary | ICD-10-CM | POA: Diagnosis not present

## 2014-05-17 DIAGNOSIS — N814 Uterovaginal prolapse, unspecified: Secondary | ICD-10-CM | POA: Diagnosis not present

## 2014-05-20 DIAGNOSIS — N8110 Cystocele, unspecified: Secondary | ICD-10-CM | POA: Diagnosis not present

## 2014-05-20 DIAGNOSIS — E782 Mixed hyperlipidemia: Secondary | ICD-10-CM | POA: Diagnosis not present

## 2014-05-20 DIAGNOSIS — I10 Essential (primary) hypertension: Secondary | ICD-10-CM | POA: Diagnosis not present

## 2014-05-20 DIAGNOSIS — Z6828 Body mass index (BMI) 28.0-28.9, adult: Secondary | ICD-10-CM | POA: Diagnosis not present

## 2014-05-24 DIAGNOSIS — L821 Other seborrheic keratosis: Secondary | ICD-10-CM | POA: Diagnosis not present

## 2014-05-24 DIAGNOSIS — L570 Actinic keratosis: Secondary | ICD-10-CM | POA: Diagnosis not present

## 2014-05-24 DIAGNOSIS — C44519 Basal cell carcinoma of skin of other part of trunk: Secondary | ICD-10-CM | POA: Diagnosis not present

## 2014-06-17 ENCOUNTER — Other Ambulatory Visit: Payer: Self-pay | Admitting: Internal Medicine

## 2014-08-02 DIAGNOSIS — Z23 Encounter for immunization: Secondary | ICD-10-CM | POA: Diagnosis not present

## 2014-08-28 ENCOUNTER — Other Ambulatory Visit: Payer: Self-pay | Admitting: Internal Medicine

## 2014-08-30 DIAGNOSIS — Z139 Encounter for screening, unspecified: Secondary | ICD-10-CM | POA: Diagnosis not present

## 2014-08-30 DIAGNOSIS — I10 Essential (primary) hypertension: Secondary | ICD-10-CM | POA: Diagnosis not present

## 2014-08-30 DIAGNOSIS — E785 Hyperlipidemia, unspecified: Secondary | ICD-10-CM | POA: Diagnosis not present

## 2014-08-30 DIAGNOSIS — N951 Menopausal and female climacteric states: Secondary | ICD-10-CM | POA: Diagnosis not present

## 2014-08-30 DIAGNOSIS — Z1389 Encounter for screening for other disorder: Secondary | ICD-10-CM | POA: Diagnosis not present

## 2014-08-30 DIAGNOSIS — Z0000 Encounter for general adult medical examination without abnormal findings: Secondary | ICD-10-CM | POA: Diagnosis not present

## 2014-09-03 DIAGNOSIS — Z1231 Encounter for screening mammogram for malignant neoplasm of breast: Secondary | ICD-10-CM | POA: Diagnosis not present

## 2014-10-04 DIAGNOSIS — C44519 Basal cell carcinoma of skin of other part of trunk: Secondary | ICD-10-CM | POA: Diagnosis not present

## 2014-10-04 DIAGNOSIS — L570 Actinic keratosis: Secondary | ICD-10-CM | POA: Diagnosis not present

## 2014-11-19 ENCOUNTER — Other Ambulatory Visit: Payer: Self-pay | Admitting: Internal Medicine

## 2014-12-22 DIAGNOSIS — I10 Essential (primary) hypertension: Secondary | ICD-10-CM | POA: Diagnosis not present

## 2014-12-30 DIAGNOSIS — E782 Mixed hyperlipidemia: Secondary | ICD-10-CM | POA: Diagnosis not present

## 2014-12-30 DIAGNOSIS — J45909 Unspecified asthma, uncomplicated: Secondary | ICD-10-CM | POA: Diagnosis not present

## 2014-12-30 DIAGNOSIS — I10 Essential (primary) hypertension: Secondary | ICD-10-CM | POA: Diagnosis not present

## 2014-12-30 DIAGNOSIS — Z6829 Body mass index (BMI) 29.0-29.9, adult: Secondary | ICD-10-CM | POA: Diagnosis not present

## 2015-02-22 ENCOUNTER — Telehealth: Payer: Self-pay | Admitting: Internal Medicine

## 2015-02-22 NOTE — Telephone Encounter (Signed)
Pt returning call [phone]

## 2015-02-22 NOTE — Telephone Encounter (Signed)
Patient has not been seen since 12-2013; she is aware that she will need to be seen and that I can send a time local Rx if needed. Pt states she will come in for OV first.    Pt was placed on 03-01-15 at 1:30pm with MW. Nothing more needed at this time.

## 2015-02-22 NOTE — Telephone Encounter (Signed)
lmtcb X1 

## 2015-03-01 ENCOUNTER — Encounter: Payer: Self-pay | Admitting: Internal Medicine

## 2015-03-01 ENCOUNTER — Ambulatory Visit (INDEPENDENT_AMBULATORY_CARE_PROVIDER_SITE_OTHER): Payer: Medicare Other | Admitting: Internal Medicine

## 2015-03-01 VITALS — BP 120/80 | HR 71 | Ht 66.0 in | Wt 181.0 lb

## 2015-03-01 DIAGNOSIS — J4520 Mild intermittent asthma, uncomplicated: Secondary | ICD-10-CM | POA: Diagnosis not present

## 2015-03-01 MED ORDER — FAMOTIDINE 20 MG PO TABS: 20.0000 mg | ORAL_TABLET | Freq: Two times a day (BID) | ORAL | Status: DC

## 2015-03-01 NOTE — Patient Instructions (Addendum)
Stop nexium  Take pepcid after bast and at bedtime    If you are satisfied with your treatment plan,  let your doctor know and he/she can either refill your medications or you can return here when your prescription runs out.     If in any way you are not 100% satisfied,  please tell us.  If 100% better, tell your friends!  Pulmonary follow up is as needed

## 2015-03-01 NOTE — Progress Notes (Signed)
Subjective:   Patient ID: Desiree Greene, female    DOB: 02-20-1946  MRN: [id number]    Brief patient profile:  6 yowf never smoker with chronic reflux symptoms onset age mid 19s and tendency to sinus problems problems in spring >  fall with sneezing and nasal congestion onset late 53s rx with otc's like benadryl then new problem with cough Jan 2014 assoc with  sob rx pred/abx > resolved  then same problem later summer and then again Nov 2014 when had the worst episode and so self referred to pulmonary 10/16/2013 for refractory cough > sob recurrent in 09/2013    History of Present Illness  10/16/2013 1st [facility] office visit/ Desiree Greene cc recurrent cough x 1 year with 4th episode starting 12/15 > seen [facility] 12/17 dx bronchitis rx zpak, no prednisone for the first time, has inhaler from prev flare not using.  Mucus is always white. Pattern of flare has been increasing freq, intensity,but still 100% resolved in between episodes s need for rx. Does note some worse HB with coughing.  Mostly sob when coughing but even when not coughing has some doe rec symbicort 80 Take 2 puffs first thing in am and then another 2 puffs about 12 hours later Work on inhaler technique:  Finish zpak For cough mucinex dm up to 1200 mg every 12 hours and if not controlled ok to take tramadol 50 mg one every 4 but may make you sleepy Nexium 40 mg   Take 30-60 min before first meal of the day and Pepcid 20 mg one bedtime until return to office - this is the best way to tell whether stomach acid is contributing to your problem.   GERD  Diet      11/19/2013 f/u ov/Desiree Greene re: chronic cough  Chief Complaint  Patient presents with  . Follow-up    Pt has no breathing complaints at this time.   She can definitely tell when she misses a dose of symbicort but other wise doing well.  Min hoarsness since starting symbicort rec Change the symbicort 160 1- 2 every 12 if breathing problems > no change in breathing off  symbicort Please see patient coordinator before you leave today  to schedule sinus ct> sinusitis > augmentin x 20 d     12/29/2013 f/u ov/Desiree Greene re: cough  Chief Complaint  Patient presents with  . Follow-up    Cough has improved--still some white mucus with cough.  Reports has acid reflux and gastritis  Not limited by breathing from desired activities   rec Montelukast 10 mg one each pm  Please see patient coordinator before you leave today  to schedule ENT eval with Dr Melina Modena and  [facility]  Stop zyrtec For cough mucinex dm up to 1200 mg every 12 hours if needed  For breathing  symbicort 160 up to 2 puffs every 12 hours if needed If condition worsens while waiting to see your [place] docs,  Short course of prednisone and augmentin can be filled    03/01/2015 f/u ov/Desiree Greene re: cough resolved  on singulair / flonase /nexium acqd  and  pepcid at hs and no need for cough meds or any inhalers  Chief Complaint  Patient presents with  . Follow-up    Pt states needing refill on pepcid. She states that her breathing is doing great and has no cough. She states that she has not taken symbicort "In a long time" and does not have an albuterol inhaler.  Has questions re nexium but doing great on rx for rhinitis and gerd off all inhaled asthma rx  Not limited by breathing from desired activities    No obvious day to day or daytime variabilty or assoc chronic cough or cp or chest tightness, subjective wheeze overt sinus  symptoms. No unusual exp hx or h/o childhood pna/ asthma or knowledge of premature birth.  Sleeping ok without nocturnal  or early am exacerbation  of respiratory  c/o's or need for noct saba. Also denies any obvious fluctuation of symptoms with weather or environmental changes or other aggravating or alleviating factors except as outlined above   Current Medications, Allergies, Complete Past Medical History, Past Surgical History, Family History, and Social History were reviewed  in [employer] record.  ROS  The following are not active complaints unless bolded sore throat, dysphagia, dental problems, itching, sneezing,  nasal congestion or excess/ purulent secretions, ear ache,   fever, chills, sweats, unintended wt loss, pleuritic or exertional cp, hemoptysis,  orthopnea pnd or leg swelling, presyncope, palpitations, heartburn, abdominal pain, anorexia, nausea, vomiting, diarrhea  or change in bowel or urinary habits, change in stools or urine, dysuria,hematuria,  rash, arthralgias, visual complaints, headache, numbness weakness or ataxia or problems with walking or coordination,  change in mood/affect or memory.          Objective:   Physical Exam   amb wf nad  03/01/2015          181  Wt Readings from Last 3 Encounters:  12/29/13 181 lb 9.6 oz (82.373 kg)  11/19/13 175 lb (79.379 kg)  10/16/13 170 lb 12.8 oz (77.474 kg)            HEENT: nl dentition, turbinates, and orophanx. Nl external ear canals without cough reflex   NECK :  without JVD/Nodes/TM/ nl carotid upstrokes bilaterally   LUNGS: no acc muscle use,  Clear to A and P s cough on insp and exp    CV:  RRR  no s3 or murmur or increase in P2, no edema   ABD:  soft and nontender with nl excursion in the supine position. No bruits or organomegaly, bowel sounds nl  MS:  warm without deformities, calf tenderness, cyanosis or clubbing  SKIN: warm and dry without lesions           Assessment & Plan:

## 2015-03-02 ENCOUNTER — Encounter: Payer: Self-pay | Admitting: Internal Medicine

## 2015-03-02 NOTE — Assessment & Plan Note (Addendum)
-    hfa 50% 10/16/2013 > 75% 11/18/13  - Allergy profile 11/19/2013 >  Eos 5.3% ,  IG=gE [id number] marked POS RAST, multiple aeroallergens - Sinus CT 11/25/2013 > Mild mucosal thickening and retained secretions within the sinuses as above. Possible fluid in the left maxillary sinus may indicate acute sinusitis > rx augmentin bid x 20 d   - added singulair 12/29/13  I had an extended final summary discussion with the patient reviewing all relevant studies completed to date and  lasting 15 to 20 minutes of a 25 minute visit on the following issues:    1) All goals of chronic asthma control met (using just singulair)  including optimal function and elimination of symptoms with minimal need for rescue therapy.  Contingencies discussed in full including contacting this office immediately if not controlling the symptoms using the rule of two's.     2) action plans reviewed if cough recurs  3) not clear what role gerd was having as a primary issue (cough from asthma can cause secondary gerd which is no longer an issue) .  Discussed the recent press about ppi's in the context of a statistically significant (but questionably clinically relevant) increase in CRI in pts on ppi vs h2's > bottom line is the lowest dose of ppi that controls   gerd is the right dose and if that dose is zero that's fine esp since h2's are cheaper.   4)  See instructions for specific recommendations which were reviewed directly with the patient who was given a copy with highlighter outlining the key components.   5) pulmonary f/u is prn

## 2015-06-27 DIAGNOSIS — I10 Essential (primary) hypertension: Secondary | ICD-10-CM | POA: Diagnosis not present

## 2015-06-27 DIAGNOSIS — E782 Mixed hyperlipidemia: Secondary | ICD-10-CM | POA: Diagnosis not present

## 2015-07-05 DIAGNOSIS — I10 Essential (primary) hypertension: Secondary | ICD-10-CM | POA: Diagnosis not present

## 2015-07-05 DIAGNOSIS — K219 Gastro-esophageal reflux disease without esophagitis: Secondary | ICD-10-CM | POA: Diagnosis not present

## 2015-07-05 DIAGNOSIS — Z6828 Body mass index (BMI) 28.0-28.9, adult: Secondary | ICD-10-CM | POA: Diagnosis not present

## 2015-07-05 DIAGNOSIS — E782 Mixed hyperlipidemia: Secondary | ICD-10-CM | POA: Diagnosis not present

## 2015-07-06 DIAGNOSIS — M1711 Unilateral primary osteoarthritis, right knee: Secondary | ICD-10-CM | POA: Diagnosis not present

## 2015-07-14 DIAGNOSIS — M25561 Pain in right knee: Secondary | ICD-10-CM | POA: Diagnosis not present

## 2015-07-14 DIAGNOSIS — M25562 Pain in left knee: Secondary | ICD-10-CM | POA: Diagnosis not present

## 2015-07-22 DIAGNOSIS — M179 Osteoarthritis of knee, unspecified: Secondary | ICD-10-CM | POA: Diagnosis not present

## 2015-07-22 DIAGNOSIS — M25561 Pain in right knee: Secondary | ICD-10-CM | POA: Diagnosis not present

## 2015-07-28 DIAGNOSIS — M2341 Loose body in knee, right knee: Secondary | ICD-10-CM | POA: Diagnosis not present

## 2015-08-03 DIAGNOSIS — G8918 Other acute postprocedural pain: Secondary | ICD-10-CM | POA: Diagnosis not present

## 2015-08-03 DIAGNOSIS — M94261 Chondromalacia, right knee: Secondary | ICD-10-CM | POA: Diagnosis not present

## 2015-08-03 DIAGNOSIS — Y999 Unspecified external cause status: Secondary | ICD-10-CM | POA: Diagnosis not present

## 2015-08-03 DIAGNOSIS — S83281A Other tear of lateral meniscus, current injury, right knee, initial encounter: Secondary | ICD-10-CM | POA: Diagnosis not present

## 2015-08-03 DIAGNOSIS — Y929 Unspecified place or not applicable: Secondary | ICD-10-CM | POA: Diagnosis not present

## 2015-08-03 DIAGNOSIS — S83231A Complex tear of medial meniscus, current injury, right knee, initial encounter: Secondary | ICD-10-CM | POA: Diagnosis not present

## 2015-08-03 DIAGNOSIS — M23251 Derangement of posterior horn of lateral meniscus due to old tear or injury, right knee: Secondary | ICD-10-CM | POA: Diagnosis not present

## 2015-08-09 DIAGNOSIS — M25661 Stiffness of right knee, not elsewhere classified: Secondary | ICD-10-CM | POA: Diagnosis not present

## 2015-08-09 DIAGNOSIS — M6281 Muscle weakness (generalized): Secondary | ICD-10-CM | POA: Diagnosis not present

## 2015-08-09 DIAGNOSIS — Z23 Encounter for immunization: Secondary | ICD-10-CM | POA: Diagnosis not present

## 2015-08-09 DIAGNOSIS — M25561 Pain in right knee: Secondary | ICD-10-CM | POA: Diagnosis not present

## 2015-08-11 DIAGNOSIS — M6281 Muscle weakness (generalized): Secondary | ICD-10-CM | POA: Diagnosis not present

## 2015-08-11 DIAGNOSIS — M25561 Pain in right knee: Secondary | ICD-10-CM | POA: Diagnosis not present

## 2015-08-11 DIAGNOSIS — M25661 Stiffness of right knee, not elsewhere classified: Secondary | ICD-10-CM | POA: Diagnosis not present

## 2015-08-16 DIAGNOSIS — M6281 Muscle weakness (generalized): Secondary | ICD-10-CM | POA: Diagnosis not present

## 2015-08-16 DIAGNOSIS — M25561 Pain in right knee: Secondary | ICD-10-CM | POA: Diagnosis not present

## 2015-08-16 DIAGNOSIS — M25661 Stiffness of right knee, not elsewhere classified: Secondary | ICD-10-CM | POA: Diagnosis not present

## 2015-08-18 DIAGNOSIS — M25561 Pain in right knee: Secondary | ICD-10-CM | POA: Diagnosis not present

## 2015-08-18 DIAGNOSIS — M25661 Stiffness of right knee, not elsewhere classified: Secondary | ICD-10-CM | POA: Diagnosis not present

## 2015-08-18 DIAGNOSIS — M6281 Muscle weakness (generalized): Secondary | ICD-10-CM | POA: Diagnosis not present

## 2015-08-19 IMAGING — CT CT PARANASAL SINUSES LIMITED
1 of 2 series · 13 of 16 positions shown, 17 images · non-contrast
Comparison: None.

CLINICAL DATA: Chronic cough and acute bronchitis. Evaluate for
sinusitis.

EXAM:
CT PARANASAL SINUS WITHOUT CONTRAST
TECHNIQUE: Multidetector CT images of the paranasal sinuses were obtained using
the standard protocol without intravenous contrast.

[Series 4: ltd sinus 3.0 h30s · axial · 0.29mm/px · z∈[-83,-7]mm · 13 of 15 slices shown, 17 images]
[im 2/15  brain]
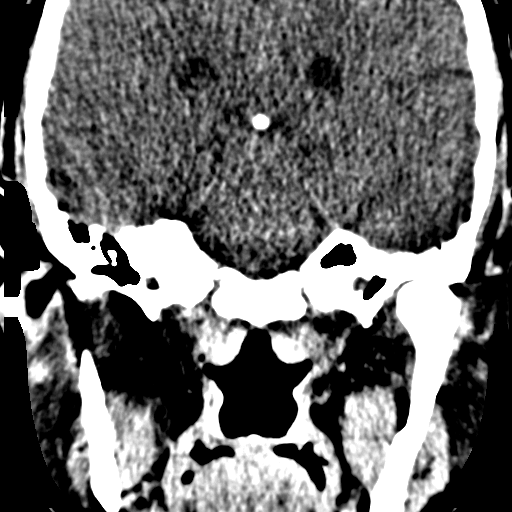
[im 2/15  bone]
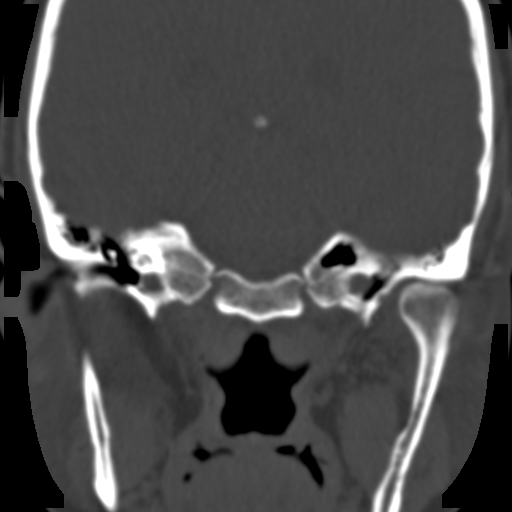
[im 3/15  bone]
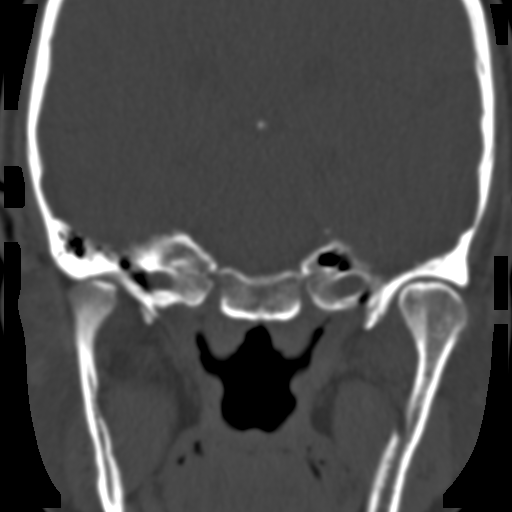
[im 4/15  bone]
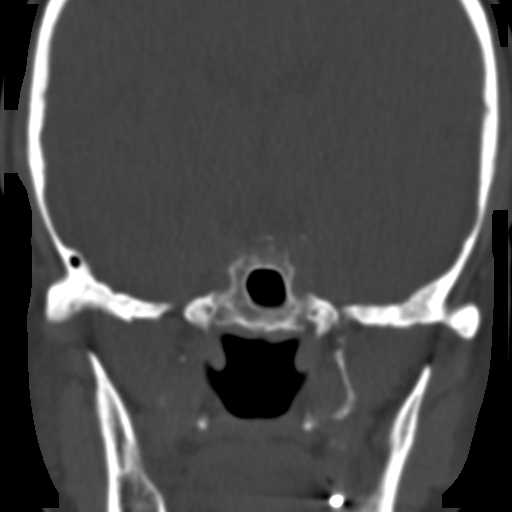
[im 5/15  bone]
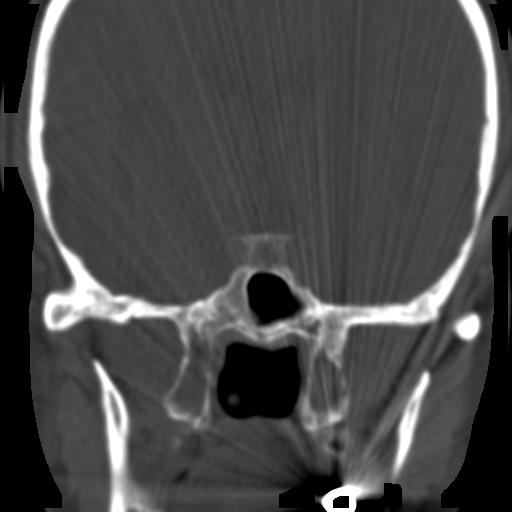
[im 6/15  brain]
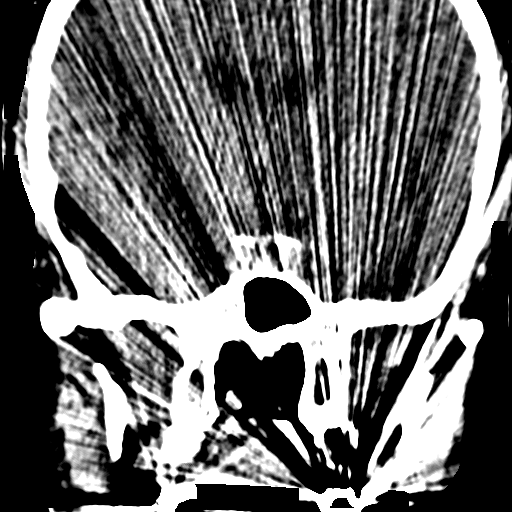
[im 6/15  bone]
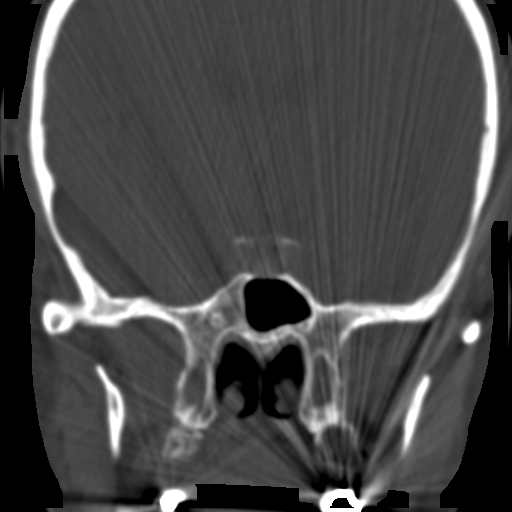
[im 7/15  bone]
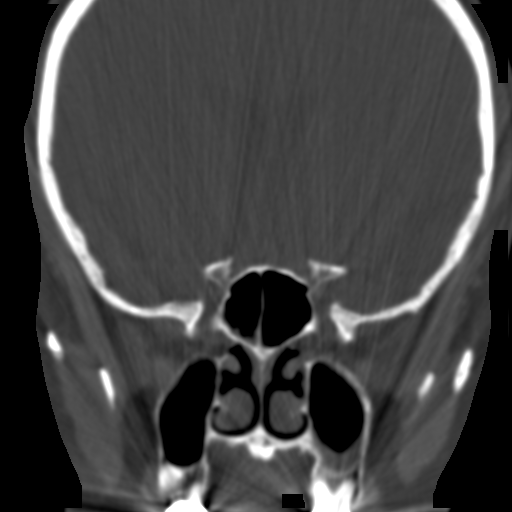
[im 8/15  bone]
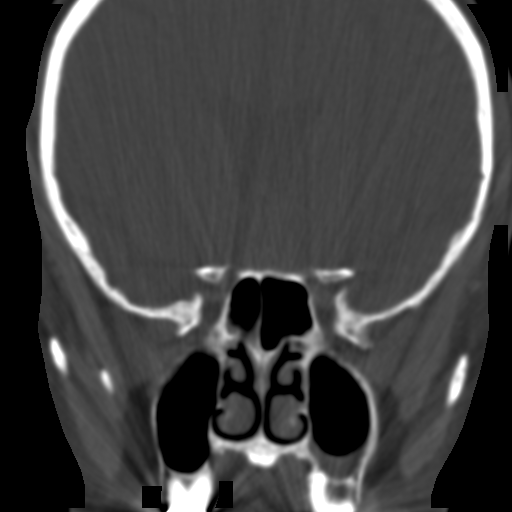
[im 9/15  bone]
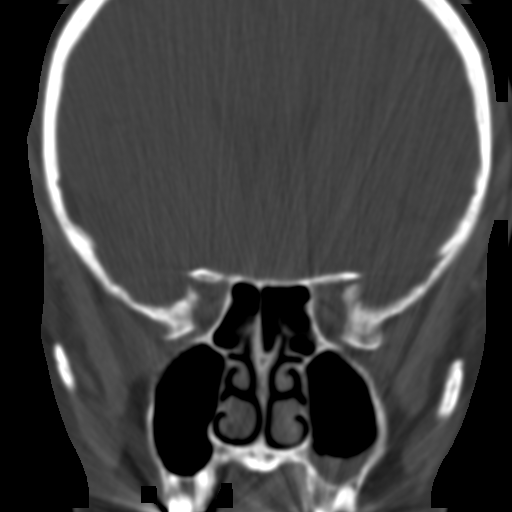
[im 10/15  brain]
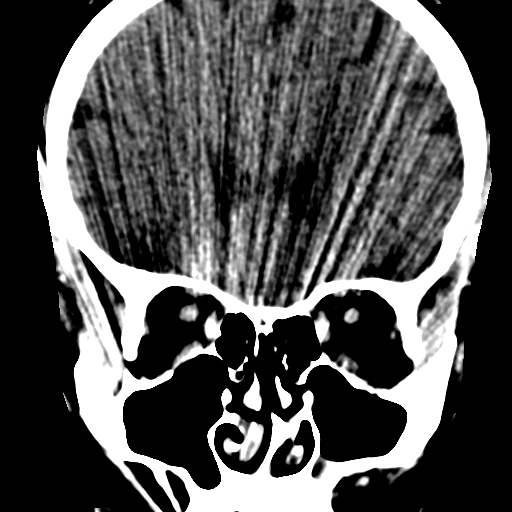
[im 10/15  bone]
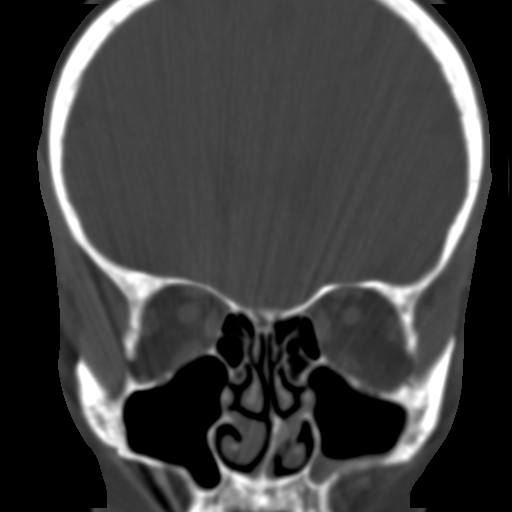
[im 11/15  bone]
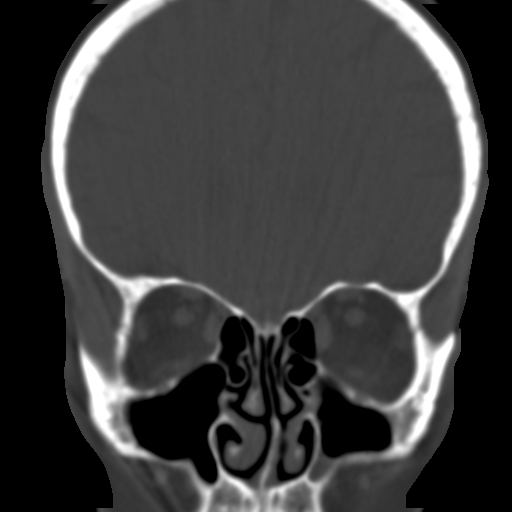
[im 12/15  bone]
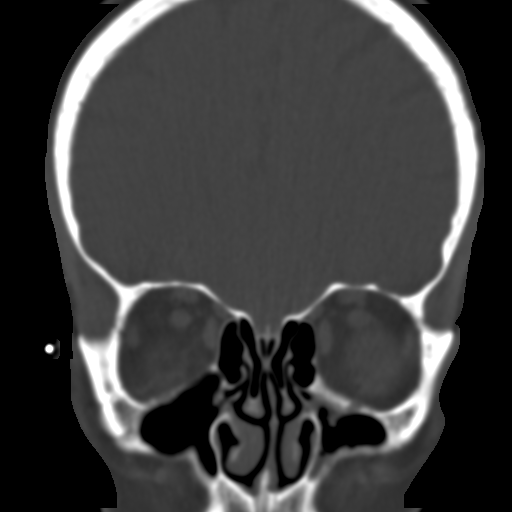
[im 13/15  bone]
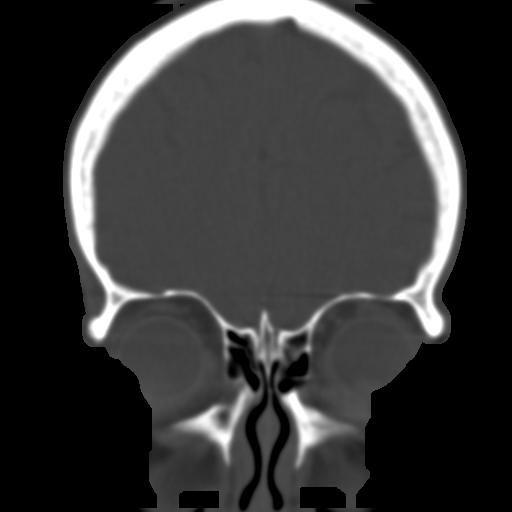
[im 14/15  brain]
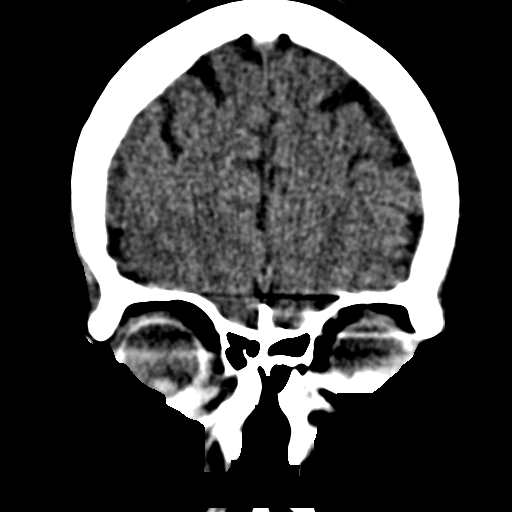
[im 14/15  bone]
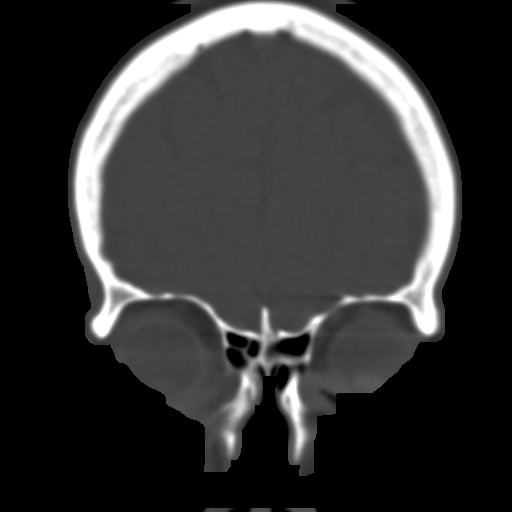

[13 of 16 positions shown; findings below may reference images not displayed]

FINDINGS: A small amount of retained secretions are present in the right
sphenoid sinus. There is also trace left frontal sinus mucosal
thickening. A small amount of opacity in the left maxillary sinus
may represent fluid or retained secretions. The visualized portions
of the right frontal sinus, bilateral ethmoid air cells, and right
maxillary sinus are clear. There is leftward nasal septal deviation.
IMPRESSION: Mild mucosal thickening and retained secretions within the sinuses
as above. Possible fluid in the left maxillary sinus may indicate
acute sinusitis.

## 2015-08-23 DIAGNOSIS — M25661 Stiffness of right knee, not elsewhere classified: Secondary | ICD-10-CM | POA: Diagnosis not present

## 2015-08-23 DIAGNOSIS — M25561 Pain in right knee: Secondary | ICD-10-CM | POA: Diagnosis not present

## 2015-08-23 DIAGNOSIS — M6281 Muscle weakness (generalized): Secondary | ICD-10-CM | POA: Diagnosis not present

## 2015-08-25 DIAGNOSIS — M6281 Muscle weakness (generalized): Secondary | ICD-10-CM | POA: Diagnosis not present

## 2015-08-25 DIAGNOSIS — M25661 Stiffness of right knee, not elsewhere classified: Secondary | ICD-10-CM | POA: Diagnosis not present

## 2015-08-25 DIAGNOSIS — M25561 Pain in right knee: Secondary | ICD-10-CM | POA: Diagnosis not present

## 2015-08-30 DIAGNOSIS — M6281 Muscle weakness (generalized): Secondary | ICD-10-CM | POA: Diagnosis not present

## 2015-08-30 DIAGNOSIS — M25561 Pain in right knee: Secondary | ICD-10-CM | POA: Diagnosis not present

## 2015-08-30 DIAGNOSIS — M25661 Stiffness of right knee, not elsewhere classified: Secondary | ICD-10-CM | POA: Diagnosis not present

## 2015-09-01 DIAGNOSIS — M6281 Muscle weakness (generalized): Secondary | ICD-10-CM | POA: Diagnosis not present

## 2015-09-01 DIAGNOSIS — M25561 Pain in right knee: Secondary | ICD-10-CM | POA: Diagnosis not present

## 2015-09-01 DIAGNOSIS — M25661 Stiffness of right knee, not elsewhere classified: Secondary | ICD-10-CM | POA: Diagnosis not present

## 2015-10-06 DIAGNOSIS — L578 Other skin changes due to chronic exposure to nonionizing radiation: Secondary | ICD-10-CM | POA: Diagnosis not present

## 2015-10-06 DIAGNOSIS — C44519 Basal cell carcinoma of skin of other part of trunk: Secondary | ICD-10-CM | POA: Diagnosis not present

## 2015-10-06 DIAGNOSIS — L821 Other seborrheic keratosis: Secondary | ICD-10-CM | POA: Diagnosis not present

## 2016-01-03 DIAGNOSIS — E782 Mixed hyperlipidemia: Secondary | ICD-10-CM | POA: Diagnosis not present

## 2016-01-03 DIAGNOSIS — I10 Essential (primary) hypertension: Secondary | ICD-10-CM | POA: Diagnosis not present

## 2016-01-05 DIAGNOSIS — H6123 Impacted cerumen, bilateral: Secondary | ICD-10-CM | POA: Diagnosis not present

## 2016-01-16 DIAGNOSIS — E782 Mixed hyperlipidemia: Secondary | ICD-10-CM | POA: Diagnosis not present

## 2016-01-16 DIAGNOSIS — Z139 Encounter for screening, unspecified: Secondary | ICD-10-CM | POA: Diagnosis not present

## 2016-01-16 DIAGNOSIS — K219 Gastro-esophageal reflux disease without esophagitis: Secondary | ICD-10-CM | POA: Diagnosis not present

## 2016-01-16 DIAGNOSIS — Z0000 Encounter for general adult medical examination without abnormal findings: Secondary | ICD-10-CM | POA: Diagnosis not present

## 2016-01-16 DIAGNOSIS — I10 Essential (primary) hypertension: Secondary | ICD-10-CM | POA: Diagnosis not present

## 2016-01-16 DIAGNOSIS — Z1389 Encounter for screening for other disorder: Secondary | ICD-10-CM | POA: Diagnosis not present

## 2016-01-23 DIAGNOSIS — H60502 Unspecified acute noninfective otitis externa, left ear: Secondary | ICD-10-CM | POA: Diagnosis not present

## 2016-01-23 DIAGNOSIS — J342 Deviated nasal septum: Secondary | ICD-10-CM | POA: Diagnosis not present

## 2016-01-23 DIAGNOSIS — H6122 Impacted cerumen, left ear: Secondary | ICD-10-CM | POA: Diagnosis not present

## 2016-01-23 DIAGNOSIS — R0981 Nasal congestion: Secondary | ICD-10-CM | POA: Diagnosis not present

## 2016-01-23 DIAGNOSIS — H61323 Acquired stenosis of external ear canal secondary to inflammation and infection, bilateral: Secondary | ICD-10-CM | POA: Diagnosis not present

## 2016-01-25 DIAGNOSIS — Z1231 Encounter for screening mammogram for malignant neoplasm of breast: Secondary | ICD-10-CM | POA: Diagnosis not present

## 2016-02-02 DIAGNOSIS — H60502 Unspecified acute noninfective otitis externa, left ear: Secondary | ICD-10-CM | POA: Diagnosis not present

## 2016-02-05 ENCOUNTER — Other Ambulatory Visit: Payer: Self-pay | Admitting: Internal Medicine

## 2016-03-14 DIAGNOSIS — J181 Lobar pneumonia, unspecified organism: Secondary | ICD-10-CM | POA: Diagnosis not present

## 2016-03-14 DIAGNOSIS — J0100 Acute maxillary sinusitis, unspecified: Secondary | ICD-10-CM | POA: Diagnosis not present

## 2016-04-20 DIAGNOSIS — H66002 Acute suppurative otitis media without spontaneous rupture of ear drum, left ear: Secondary | ICD-10-CM | POA: Diagnosis not present

## 2016-05-07 ENCOUNTER — Other Ambulatory Visit: Payer: Self-pay | Admitting: Internal Medicine

## 2016-05-17 DIAGNOSIS — I10 Essential (primary) hypertension: Secondary | ICD-10-CM | POA: Diagnosis not present

## 2016-05-17 DIAGNOSIS — E782 Mixed hyperlipidemia: Secondary | ICD-10-CM | POA: Diagnosis not present

## 2016-06-04 DIAGNOSIS — I10 Essential (primary) hypertension: Secondary | ICD-10-CM | POA: Diagnosis not present

## 2016-06-04 DIAGNOSIS — K219 Gastro-esophageal reflux disease without esophagitis: Secondary | ICD-10-CM | POA: Diagnosis not present

## 2016-06-04 DIAGNOSIS — E782 Mixed hyperlipidemia: Secondary | ICD-10-CM | POA: Diagnosis not present

## 2016-06-04 DIAGNOSIS — Z6828 Body mass index (BMI) 28.0-28.9, adult: Secondary | ICD-10-CM | POA: Diagnosis not present

## 2016-07-25 DIAGNOSIS — Z23 Encounter for immunization: Secondary | ICD-10-CM | POA: Diagnosis not present

## 2016-09-10 DIAGNOSIS — J324 Chronic pansinusitis: Secondary | ICD-10-CM | POA: Diagnosis not present

## 2016-10-04 DIAGNOSIS — L820 Inflamed seborrheic keratosis: Secondary | ICD-10-CM | POA: Diagnosis not present

## 2016-10-04 DIAGNOSIS — L570 Actinic keratosis: Secondary | ICD-10-CM | POA: Diagnosis not present

## 2016-10-04 DIAGNOSIS — L821 Other seborrheic keratosis: Secondary | ICD-10-CM | POA: Diagnosis not present

## 2016-10-04 DIAGNOSIS — L578 Other skin changes due to chronic exposure to nonionizing radiation: Secondary | ICD-10-CM | POA: Diagnosis not present

## 2016-11-30 DIAGNOSIS — J209 Acute bronchitis, unspecified: Secondary | ICD-10-CM | POA: Diagnosis not present

## 2016-12-19 DIAGNOSIS — E782 Mixed hyperlipidemia: Secondary | ICD-10-CM | POA: Diagnosis not present

## 2016-12-19 DIAGNOSIS — I10 Essential (primary) hypertension: Secondary | ICD-10-CM | POA: Diagnosis not present

## 2017-01-01 DIAGNOSIS — Z6827 Body mass index (BMI) 27.0-27.9, adult: Secondary | ICD-10-CM | POA: Diagnosis not present

## 2017-01-01 DIAGNOSIS — E782 Mixed hyperlipidemia: Secondary | ICD-10-CM | POA: Diagnosis not present

## 2017-01-01 DIAGNOSIS — I10 Essential (primary) hypertension: Secondary | ICD-10-CM | POA: Diagnosis not present

## 2017-01-01 DIAGNOSIS — K219 Gastro-esophageal reflux disease without esophagitis: Secondary | ICD-10-CM | POA: Diagnosis not present

## 2017-04-09 DIAGNOSIS — Z1231 Encounter for screening mammogram for malignant neoplasm of breast: Secondary | ICD-10-CM | POA: Diagnosis not present

## 2017-07-08 DIAGNOSIS — Z23 Encounter for immunization: Secondary | ICD-10-CM | POA: Diagnosis not present

## 2017-07-10 DIAGNOSIS — E782 Mixed hyperlipidemia: Secondary | ICD-10-CM | POA: Diagnosis not present

## 2017-07-10 DIAGNOSIS — I10 Essential (primary) hypertension: Secondary | ICD-10-CM | POA: Diagnosis not present

## 2017-07-15 DIAGNOSIS — E782 Mixed hyperlipidemia: Secondary | ICD-10-CM | POA: Diagnosis not present

## 2017-07-15 DIAGNOSIS — I10 Essential (primary) hypertension: Secondary | ICD-10-CM | POA: Diagnosis not present

## 2017-07-15 DIAGNOSIS — R7301 Impaired fasting glucose: Secondary | ICD-10-CM | POA: Diagnosis not present

## 2017-07-15 DIAGNOSIS — Z7189 Other specified counseling: Secondary | ICD-10-CM | POA: Diagnosis not present

## 2017-07-15 DIAGNOSIS — Z139 Encounter for screening, unspecified: Secondary | ICD-10-CM | POA: Diagnosis not present

## 2017-07-15 DIAGNOSIS — Z1389 Encounter for screening for other disorder: Secondary | ICD-10-CM | POA: Diagnosis not present

## 2017-07-15 DIAGNOSIS — M79609 Pain in unspecified limb: Secondary | ICD-10-CM | POA: Diagnosis not present

## 2017-07-15 DIAGNOSIS — F3160 Bipolar disorder, current episode mixed, unspecified: Secondary | ICD-10-CM | POA: Diagnosis not present

## 2017-07-15 DIAGNOSIS — Z1379 Encounter for other screening for genetic and chromosomal anomalies: Secondary | ICD-10-CM | POA: Diagnosis not present

## 2017-07-19 DIAGNOSIS — R7303 Prediabetes: Secondary | ICD-10-CM | POA: Diagnosis not present

## 2017-07-19 DIAGNOSIS — Z6827 Body mass index (BMI) 27.0-27.9, adult: Secondary | ICD-10-CM | POA: Diagnosis not present

## 2017-09-04 DIAGNOSIS — R509 Fever, unspecified: Secondary | ICD-10-CM | POA: Diagnosis not present

## 2017-09-04 DIAGNOSIS — J0100 Acute maxillary sinusitis, unspecified: Secondary | ICD-10-CM | POA: Diagnosis not present

## 2017-10-02 DIAGNOSIS — L570 Actinic keratosis: Secondary | ICD-10-CM | POA: Diagnosis not present

## 2017-10-02 DIAGNOSIS — L578 Other skin changes due to chronic exposure to nonionizing radiation: Secondary | ICD-10-CM | POA: Diagnosis not present

## 2017-10-02 DIAGNOSIS — L821 Other seborrheic keratosis: Secondary | ICD-10-CM | POA: Diagnosis not present

## 2017-10-02 DIAGNOSIS — L853 Xerosis cutis: Secondary | ICD-10-CM | POA: Diagnosis not present

## 2017-12-03 DIAGNOSIS — E782 Mixed hyperlipidemia: Secondary | ICD-10-CM | POA: Diagnosis not present

## 2017-12-03 DIAGNOSIS — R7301 Impaired fasting glucose: Secondary | ICD-10-CM | POA: Diagnosis not present

## 2017-12-03 DIAGNOSIS — I10 Essential (primary) hypertension: Secondary | ICD-10-CM | POA: Diagnosis not present

## 2017-12-06 DIAGNOSIS — K591 Functional diarrhea: Secondary | ICD-10-CM | POA: Diagnosis not present

## 2017-12-11 DIAGNOSIS — I10 Essential (primary) hypertension: Secondary | ICD-10-CM | POA: Diagnosis not present

## 2017-12-11 DIAGNOSIS — R7303 Prediabetes: Secondary | ICD-10-CM | POA: Diagnosis not present

## 2017-12-11 DIAGNOSIS — E782 Mixed hyperlipidemia: Secondary | ICD-10-CM | POA: Diagnosis not present

## 2018-05-28 ENCOUNTER — Other Ambulatory Visit: Payer: Self-pay

## 2018-06-04 DIAGNOSIS — R7303 Prediabetes: Secondary | ICD-10-CM | POA: Diagnosis not present

## 2018-06-04 DIAGNOSIS — E782 Mixed hyperlipidemia: Secondary | ICD-10-CM | POA: Diagnosis not present

## 2018-06-04 DIAGNOSIS — I10 Essential (primary) hypertension: Secondary | ICD-10-CM | POA: Diagnosis not present

## 2018-06-11 DIAGNOSIS — Z0000 Encounter for general adult medical examination without abnormal findings: Secondary | ICD-10-CM | POA: Diagnosis not present

## 2018-06-11 DIAGNOSIS — R7303 Prediabetes: Secondary | ICD-10-CM | POA: Diagnosis not present

## 2018-06-11 DIAGNOSIS — Z1231 Encounter for screening mammogram for malignant neoplasm of breast: Secondary | ICD-10-CM | POA: Diagnosis not present

## 2018-06-11 DIAGNOSIS — E782 Mixed hyperlipidemia: Secondary | ICD-10-CM | POA: Diagnosis not present

## 2018-06-11 DIAGNOSIS — Z1331 Encounter for screening for depression: Secondary | ICD-10-CM | POA: Diagnosis not present

## 2018-06-11 DIAGNOSIS — I10 Essential (primary) hypertension: Secondary | ICD-10-CM | POA: Diagnosis not present

## 2018-06-18 DIAGNOSIS — Z1231 Encounter for screening mammogram for malignant neoplasm of breast: Secondary | ICD-10-CM | POA: Diagnosis not present

## 2018-07-16 DIAGNOSIS — R05 Cough: Secondary | ICD-10-CM | POA: Diagnosis not present

## 2018-07-16 DIAGNOSIS — J040 Acute laryngitis: Secondary | ICD-10-CM | POA: Diagnosis not present

## 2018-07-30 DIAGNOSIS — Z23 Encounter for immunization: Secondary | ICD-10-CM | POA: Diagnosis not present

## 2018-10-03 DIAGNOSIS — L578 Other skin changes due to chronic exposure to nonionizing radiation: Secondary | ICD-10-CM | POA: Diagnosis not present

## 2018-10-03 DIAGNOSIS — L821 Other seborrheic keratosis: Secondary | ICD-10-CM | POA: Diagnosis not present

## 2018-12-03 DIAGNOSIS — I10 Essential (primary) hypertension: Secondary | ICD-10-CM | POA: Diagnosis not present

## 2018-12-03 DIAGNOSIS — E782 Mixed hyperlipidemia: Secondary | ICD-10-CM | POA: Diagnosis not present

## 2018-12-03 DIAGNOSIS — R7303 Prediabetes: Secondary | ICD-10-CM | POA: Diagnosis not present

## 2018-12-10 DIAGNOSIS — R7303 Prediabetes: Secondary | ICD-10-CM | POA: Diagnosis not present

## 2018-12-10 DIAGNOSIS — E782 Mixed hyperlipidemia: Secondary | ICD-10-CM | POA: Diagnosis not present

## 2018-12-10 DIAGNOSIS — Z139 Encounter for screening, unspecified: Secondary | ICD-10-CM | POA: Diagnosis not present

## 2018-12-10 DIAGNOSIS — I10 Essential (primary) hypertension: Secondary | ICD-10-CM | POA: Diagnosis not present

## 2019-07-23 DIAGNOSIS — Z23 Encounter for immunization: Secondary | ICD-10-CM | POA: Diagnosis not present

## 2019-10-09 DIAGNOSIS — L578 Other skin changes due to chronic exposure to nonionizing radiation: Secondary | ICD-10-CM | POA: Diagnosis not present

## 2019-10-09 DIAGNOSIS — L821 Other seborrheic keratosis: Secondary | ICD-10-CM | POA: Diagnosis not present

## 2019-10-09 DIAGNOSIS — L570 Actinic keratosis: Secondary | ICD-10-CM | POA: Diagnosis not present

## 2020-01-05 DIAGNOSIS — R7301 Impaired fasting glucose: Secondary | ICD-10-CM | POA: Diagnosis not present

## 2020-01-05 DIAGNOSIS — Z1339 Encounter for screening examination for other mental health and behavioral disorders: Secondary | ICD-10-CM | POA: Diagnosis not present

## 2020-01-05 DIAGNOSIS — E782 Mixed hyperlipidemia: Secondary | ICD-10-CM | POA: Diagnosis not present

## 2020-01-05 DIAGNOSIS — Z136 Encounter for screening for cardiovascular disorders: Secondary | ICD-10-CM | POA: Diagnosis not present

## 2020-01-05 DIAGNOSIS — Z0000 Encounter for general adult medical examination without abnormal findings: Secondary | ICD-10-CM | POA: Diagnosis not present

## 2020-01-05 DIAGNOSIS — Z139 Encounter for screening, unspecified: Secondary | ICD-10-CM | POA: Diagnosis not present

## 2020-01-05 DIAGNOSIS — Z1331 Encounter for screening for depression: Secondary | ICD-10-CM | POA: Diagnosis not present

## 2020-01-05 DIAGNOSIS — Z7189 Other specified counseling: Secondary | ICD-10-CM | POA: Diagnosis not present

## 2020-01-13 DIAGNOSIS — R7303 Prediabetes: Secondary | ICD-10-CM | POA: Diagnosis not present

## 2020-01-13 DIAGNOSIS — E781 Pure hyperglyceridemia: Secondary | ICD-10-CM | POA: Diagnosis not present

## 2020-01-13 DIAGNOSIS — Z139 Encounter for screening, unspecified: Secondary | ICD-10-CM | POA: Diagnosis not present

## 2020-01-13 DIAGNOSIS — I10 Essential (primary) hypertension: Secondary | ICD-10-CM | POA: Diagnosis not present

## 2020-01-27 DIAGNOSIS — I10 Essential (primary) hypertension: Secondary | ICD-10-CM | POA: Diagnosis not present

## 2020-01-27 DIAGNOSIS — R7303 Prediabetes: Secondary | ICD-10-CM | POA: Diagnosis not present

## 2020-01-27 DIAGNOSIS — E781 Pure hyperglyceridemia: Secondary | ICD-10-CM | POA: Diagnosis not present

## 2020-02-08 DIAGNOSIS — S20229A Contusion of unspecified back wall of thorax, initial encounter: Secondary | ICD-10-CM | POA: Diagnosis not present

## 2020-06-10 DIAGNOSIS — R7303 Prediabetes: Secondary | ICD-10-CM | POA: Diagnosis not present

## 2020-06-10 DIAGNOSIS — I10 Essential (primary) hypertension: Secondary | ICD-10-CM | POA: Diagnosis not present

## 2020-06-10 DIAGNOSIS — E782 Mixed hyperlipidemia: Secondary | ICD-10-CM | POA: Diagnosis not present

## 2020-06-17 DIAGNOSIS — Z6825 Body mass index (BMI) 25.0-25.9, adult: Secondary | ICD-10-CM | POA: Diagnosis not present

## 2020-06-17 DIAGNOSIS — R7303 Prediabetes: Secondary | ICD-10-CM | POA: Diagnosis not present

## 2020-06-17 DIAGNOSIS — E782 Mixed hyperlipidemia: Secondary | ICD-10-CM | POA: Diagnosis not present

## 2020-06-17 DIAGNOSIS — I10 Essential (primary) hypertension: Secondary | ICD-10-CM | POA: Diagnosis not present

## 2020-06-27 DIAGNOSIS — Z6825 Body mass index (BMI) 25.0-25.9, adult: Secondary | ICD-10-CM | POA: Diagnosis not present

## 2020-06-27 DIAGNOSIS — N390 Urinary tract infection, site not specified: Secondary | ICD-10-CM | POA: Diagnosis not present

## 2020-07-27 DIAGNOSIS — Z23 Encounter for immunization: Secondary | ICD-10-CM | POA: Diagnosis not present

## 2020-08-23 DIAGNOSIS — Z23 Encounter for immunization: Secondary | ICD-10-CM | POA: Diagnosis not present

## 2020-09-28 DIAGNOSIS — Z466 Encounter for fitting and adjustment of urinary device: Secondary | ICD-10-CM | POA: Diagnosis not present

## 2020-09-28 DIAGNOSIS — N813 Complete uterovaginal prolapse: Secondary | ICD-10-CM | POA: Diagnosis not present

## 2020-10-07 DIAGNOSIS — L570 Actinic keratosis: Secondary | ICD-10-CM | POA: Diagnosis not present

## 2020-10-07 DIAGNOSIS — L821 Other seborrheic keratosis: Secondary | ICD-10-CM | POA: Diagnosis not present

## 2020-10-07 DIAGNOSIS — L578 Other skin changes due to chronic exposure to nonionizing radiation: Secondary | ICD-10-CM | POA: Diagnosis not present

## 2020-10-24 DIAGNOSIS — Z4689 Encounter for fitting and adjustment of other specified devices: Secondary | ICD-10-CM | POA: Diagnosis not present

## 2020-10-24 DIAGNOSIS — N813 Complete uterovaginal prolapse: Secondary | ICD-10-CM | POA: Diagnosis not present

## 2020-11-08 DIAGNOSIS — H52223 Regular astigmatism, bilateral: Secondary | ICD-10-CM | POA: Diagnosis not present

## 2020-11-08 DIAGNOSIS — H43393 Other vitreous opacities, bilateral: Secondary | ICD-10-CM | POA: Diagnosis not present

## 2020-11-08 DIAGNOSIS — H25093 Other age-related incipient cataract, bilateral: Secondary | ICD-10-CM | POA: Diagnosis not present

## 2020-11-08 DIAGNOSIS — H524 Presbyopia: Secondary | ICD-10-CM | POA: Diagnosis not present

## 2020-11-08 DIAGNOSIS — H5203 Hypermetropia, bilateral: Secondary | ICD-10-CM | POA: Diagnosis not present

## 2020-11-08 DIAGNOSIS — I10 Essential (primary) hypertension: Secondary | ICD-10-CM | POA: Diagnosis not present

## 2020-11-15 DIAGNOSIS — H2512 Age-related nuclear cataract, left eye: Secondary | ICD-10-CM | POA: Diagnosis not present

## 2020-11-15 DIAGNOSIS — Z01818 Encounter for other preprocedural examination: Secondary | ICD-10-CM | POA: Diagnosis not present

## 2020-11-15 DIAGNOSIS — H2513 Age-related nuclear cataract, bilateral: Secondary | ICD-10-CM | POA: Diagnosis not present

## 2020-11-15 DIAGNOSIS — H18523 Epithelial (juvenile) corneal dystrophy, bilateral: Secondary | ICD-10-CM | POA: Diagnosis not present

## 2020-11-15 DIAGNOSIS — H2511 Age-related nuclear cataract, right eye: Secondary | ICD-10-CM | POA: Diagnosis not present

## 2020-11-15 DIAGNOSIS — H40013 Open angle with borderline findings, low risk, bilateral: Secondary | ICD-10-CM | POA: Diagnosis not present

## 2020-12-06 DIAGNOSIS — E785 Hyperlipidemia, unspecified: Secondary | ICD-10-CM | POA: Diagnosis not present

## 2020-12-06 DIAGNOSIS — Z79899 Other long term (current) drug therapy: Secondary | ICD-10-CM | POA: Diagnosis not present

## 2020-12-06 DIAGNOSIS — H18523 Epithelial (juvenile) corneal dystrophy, bilateral: Secondary | ICD-10-CM | POA: Diagnosis not present

## 2020-12-06 DIAGNOSIS — Z9841 Cataract extraction status, right eye: Secondary | ICD-10-CM | POA: Diagnosis not present

## 2020-12-06 DIAGNOSIS — Z961 Presence of intraocular lens: Secondary | ICD-10-CM | POA: Diagnosis not present

## 2020-12-06 DIAGNOSIS — H259 Unspecified age-related cataract: Secondary | ICD-10-CM | POA: Diagnosis not present

## 2020-12-06 DIAGNOSIS — M1990 Unspecified osteoarthritis, unspecified site: Secondary | ICD-10-CM | POA: Diagnosis not present

## 2020-12-06 DIAGNOSIS — K219 Gastro-esophageal reflux disease without esophagitis: Secondary | ICD-10-CM | POA: Diagnosis not present

## 2020-12-06 DIAGNOSIS — I10 Essential (primary) hypertension: Secondary | ICD-10-CM | POA: Diagnosis not present

## 2020-12-06 DIAGNOSIS — H40013 Open angle with borderline findings, low risk, bilateral: Secondary | ICD-10-CM | POA: Diagnosis not present

## 2020-12-06 DIAGNOSIS — H25811 Combined forms of age-related cataract, right eye: Secondary | ICD-10-CM | POA: Diagnosis not present

## 2020-12-06 DIAGNOSIS — H52213 Irregular astigmatism, bilateral: Secondary | ICD-10-CM | POA: Diagnosis not present

## 2020-12-06 DIAGNOSIS — Z9842 Cataract extraction status, left eye: Secondary | ICD-10-CM | POA: Diagnosis not present

## 2020-12-06 DIAGNOSIS — H25812 Combined forms of age-related cataract, left eye: Secondary | ICD-10-CM | POA: Diagnosis not present

## 2020-12-06 DIAGNOSIS — R0683 Snoring: Secondary | ICD-10-CM | POA: Diagnosis not present

## 2020-12-06 DIAGNOSIS — H2511 Age-related nuclear cataract, right eye: Secondary | ICD-10-CM | POA: Diagnosis not present

## 2020-12-20 DIAGNOSIS — E785 Hyperlipidemia, unspecified: Secondary | ICD-10-CM | POA: Diagnosis not present

## 2020-12-20 DIAGNOSIS — H25812 Combined forms of age-related cataract, left eye: Secondary | ICD-10-CM | POA: Diagnosis not present

## 2020-12-20 DIAGNOSIS — Z9841 Cataract extraction status, right eye: Secondary | ICD-10-CM | POA: Diagnosis not present

## 2020-12-20 DIAGNOSIS — Z961 Presence of intraocular lens: Secondary | ICD-10-CM | POA: Diagnosis not present

## 2020-12-20 DIAGNOSIS — H25811 Combined forms of age-related cataract, right eye: Secondary | ICD-10-CM | POA: Diagnosis not present

## 2020-12-20 DIAGNOSIS — H52223 Regular astigmatism, bilateral: Secondary | ICD-10-CM | POA: Diagnosis not present

## 2020-12-20 DIAGNOSIS — M1990 Unspecified osteoarthritis, unspecified site: Secondary | ICD-10-CM | POA: Diagnosis not present

## 2020-12-20 DIAGNOSIS — K219 Gastro-esophageal reflux disease without esophagitis: Secondary | ICD-10-CM | POA: Diagnosis not present

## 2020-12-20 DIAGNOSIS — H259 Unspecified age-related cataract: Secondary | ICD-10-CM | POA: Diagnosis not present

## 2020-12-20 DIAGNOSIS — Z9842 Cataract extraction status, left eye: Secondary | ICD-10-CM | POA: Diagnosis not present

## 2020-12-20 DIAGNOSIS — H2512 Age-related nuclear cataract, left eye: Secondary | ICD-10-CM | POA: Diagnosis not present

## 2020-12-20 DIAGNOSIS — H524 Presbyopia: Secondary | ICD-10-CM | POA: Diagnosis not present

## 2020-12-20 DIAGNOSIS — Z7982 Long term (current) use of aspirin: Secondary | ICD-10-CM | POA: Diagnosis not present

## 2020-12-20 DIAGNOSIS — Z79899 Other long term (current) drug therapy: Secondary | ICD-10-CM | POA: Diagnosis not present

## 2020-12-20 DIAGNOSIS — I10 Essential (primary) hypertension: Secondary | ICD-10-CM | POA: Diagnosis not present

## 2020-12-20 DIAGNOSIS — R0683 Snoring: Secondary | ICD-10-CM | POA: Diagnosis not present

## 2020-12-20 DIAGNOSIS — H5203 Hypermetropia, bilateral: Secondary | ICD-10-CM | POA: Diagnosis not present

## 2020-12-28 DIAGNOSIS — E782 Mixed hyperlipidemia: Secondary | ICD-10-CM | POA: Diagnosis not present

## 2020-12-28 DIAGNOSIS — R7303 Prediabetes: Secondary | ICD-10-CM | POA: Diagnosis not present

## 2021-01-04 DIAGNOSIS — Z4689 Encounter for fitting and adjustment of other specified devices: Secondary | ICD-10-CM | POA: Diagnosis not present

## 2021-01-04 DIAGNOSIS — N814 Uterovaginal prolapse, unspecified: Secondary | ICD-10-CM | POA: Diagnosis not present

## 2021-01-06 DIAGNOSIS — E669 Obesity, unspecified: Secondary | ICD-10-CM | POA: Diagnosis not present

## 2021-01-06 DIAGNOSIS — I10 Essential (primary) hypertension: Secondary | ICD-10-CM | POA: Diagnosis not present

## 2021-01-06 DIAGNOSIS — Z1331 Encounter for screening for depression: Secondary | ICD-10-CM | POA: Diagnosis not present

## 2021-01-06 DIAGNOSIS — E782 Mixed hyperlipidemia: Secondary | ICD-10-CM | POA: Diagnosis not present

## 2021-01-06 DIAGNOSIS — Z0000 Encounter for general adult medical examination without abnormal findings: Secondary | ICD-10-CM | POA: Diagnosis not present

## 2021-01-06 DIAGNOSIS — Z6825 Body mass index (BMI) 25.0-25.9, adult: Secondary | ICD-10-CM | POA: Diagnosis not present

## 2021-01-06 DIAGNOSIS — R7303 Prediabetes: Secondary | ICD-10-CM | POA: Diagnosis not present

## 2021-01-06 DIAGNOSIS — Z139 Encounter for screening, unspecified: Secondary | ICD-10-CM | POA: Diagnosis not present

## 2021-01-06 DIAGNOSIS — E876 Hypokalemia: Secondary | ICD-10-CM | POA: Diagnosis not present

## 2021-01-06 DIAGNOSIS — Z1339 Encounter for screening examination for other mental health and behavioral disorders: Secondary | ICD-10-CM | POA: Diagnosis not present

## 2021-01-30 DIAGNOSIS — Z1231 Encounter for screening mammogram for malignant neoplasm of breast: Secondary | ICD-10-CM | POA: Diagnosis not present

## 2021-06-06 DIAGNOSIS — E782 Mixed hyperlipidemia: Secondary | ICD-10-CM | POA: Diagnosis not present

## 2021-06-06 DIAGNOSIS — R7303 Prediabetes: Secondary | ICD-10-CM | POA: Diagnosis not present

## 2021-06-12 DIAGNOSIS — E782 Mixed hyperlipidemia: Secondary | ICD-10-CM | POA: Diagnosis not present

## 2021-06-12 DIAGNOSIS — R7303 Prediabetes: Secondary | ICD-10-CM | POA: Diagnosis not present

## 2021-06-12 DIAGNOSIS — Z6824 Body mass index (BMI) 24.0-24.9, adult: Secondary | ICD-10-CM | POA: Diagnosis not present

## 2021-06-12 DIAGNOSIS — I10 Essential (primary) hypertension: Secondary | ICD-10-CM | POA: Diagnosis not present

## 2021-07-26 DIAGNOSIS — N813 Complete uterovaginal prolapse: Secondary | ICD-10-CM | POA: Diagnosis not present

## 2021-07-26 DIAGNOSIS — Z4689 Encounter for fitting and adjustment of other specified devices: Secondary | ICD-10-CM | POA: Diagnosis not present

## 2021-08-01 DIAGNOSIS — Z23 Encounter for immunization: Secondary | ICD-10-CM | POA: Diagnosis not present

## 2021-08-18 DIAGNOSIS — Z23 Encounter for immunization: Secondary | ICD-10-CM | POA: Diagnosis not present

## 2021-12-11 DIAGNOSIS — E782 Mixed hyperlipidemia: Secondary | ICD-10-CM | POA: Diagnosis not present

## 2021-12-11 DIAGNOSIS — R7303 Prediabetes: Secondary | ICD-10-CM | POA: Diagnosis not present

## 2021-12-18 DIAGNOSIS — R7303 Prediabetes: Secondary | ICD-10-CM | POA: Diagnosis not present

## 2021-12-18 DIAGNOSIS — I10 Essential (primary) hypertension: Secondary | ICD-10-CM | POA: Diagnosis not present

## 2021-12-18 DIAGNOSIS — Z6824 Body mass index (BMI) 24.0-24.9, adult: Secondary | ICD-10-CM | POA: Diagnosis not present

## 2021-12-18 DIAGNOSIS — E782 Mixed hyperlipidemia: Secondary | ICD-10-CM | POA: Diagnosis not present

## 2021-12-19 DIAGNOSIS — L570 Actinic keratosis: Secondary | ICD-10-CM | POA: Diagnosis not present

## 2021-12-19 DIAGNOSIS — L578 Other skin changes due to chronic exposure to nonionizing radiation: Secondary | ICD-10-CM | POA: Diagnosis not present

## 2021-12-19 DIAGNOSIS — L821 Other seborrheic keratosis: Secondary | ICD-10-CM | POA: Diagnosis not present

## 2022-01-08 DIAGNOSIS — Z1331 Encounter for screening for depression: Secondary | ICD-10-CM | POA: Diagnosis not present

## 2022-01-08 DIAGNOSIS — R636 Underweight: Secondary | ICD-10-CM | POA: Diagnosis not present

## 2022-01-08 DIAGNOSIS — Z139 Encounter for screening, unspecified: Secondary | ICD-10-CM | POA: Diagnosis not present

## 2022-01-08 DIAGNOSIS — Z0000 Encounter for general adult medical examination without abnormal findings: Secondary | ICD-10-CM | POA: Diagnosis not present

## 2022-01-08 DIAGNOSIS — Z1339 Encounter for screening examination for other mental health and behavioral disorders: Secondary | ICD-10-CM | POA: Diagnosis not present

## 2022-02-22 DIAGNOSIS — Z4689 Encounter for fitting and adjustment of other specified devices: Secondary | ICD-10-CM | POA: Diagnosis not present

## 2022-03-08 DIAGNOSIS — H43393 Other vitreous opacities, bilateral: Secondary | ICD-10-CM | POA: Diagnosis not present

## 2022-03-08 DIAGNOSIS — H524 Presbyopia: Secondary | ICD-10-CM | POA: Diagnosis not present

## 2022-03-08 DIAGNOSIS — H5203 Hypermetropia, bilateral: Secondary | ICD-10-CM | POA: Diagnosis not present

## 2022-03-08 DIAGNOSIS — Z9849 Cataract extraction status, unspecified eye: Secondary | ICD-10-CM | POA: Diagnosis not present

## 2022-03-08 DIAGNOSIS — H52223 Regular astigmatism, bilateral: Secondary | ICD-10-CM | POA: Diagnosis not present

## 2022-03-08 DIAGNOSIS — Z961 Presence of intraocular lens: Secondary | ICD-10-CM | POA: Diagnosis not present

## 2022-03-08 DIAGNOSIS — I10 Essential (primary) hypertension: Secondary | ICD-10-CM | POA: Diagnosis not present

## 2022-03-23 DIAGNOSIS — Z1231 Encounter for screening mammogram for malignant neoplasm of breast: Secondary | ICD-10-CM | POA: Diagnosis not present

## 2022-04-16 DIAGNOSIS — L570 Actinic keratosis: Secondary | ICD-10-CM | POA: Diagnosis not present

## 2022-04-16 DIAGNOSIS — L578 Other skin changes due to chronic exposure to nonionizing radiation: Secondary | ICD-10-CM | POA: Diagnosis not present

## 2022-04-16 DIAGNOSIS — L820 Inflamed seborrheic keratosis: Secondary | ICD-10-CM | POA: Diagnosis not present

## 2022-06-11 DIAGNOSIS — I10 Essential (primary) hypertension: Secondary | ICD-10-CM | POA: Diagnosis not present

## 2022-06-11 DIAGNOSIS — R7303 Prediabetes: Secondary | ICD-10-CM | POA: Diagnosis not present

## 2022-06-11 DIAGNOSIS — E782 Mixed hyperlipidemia: Secondary | ICD-10-CM | POA: Diagnosis not present

## 2022-06-18 DIAGNOSIS — Z6824 Body mass index (BMI) 24.0-24.9, adult: Secondary | ICD-10-CM | POA: Diagnosis not present

## 2022-06-18 DIAGNOSIS — Z1231 Encounter for screening mammogram for malignant neoplasm of breast: Secondary | ICD-10-CM | POA: Diagnosis not present

## 2022-06-18 DIAGNOSIS — I10 Essential (primary) hypertension: Secondary | ICD-10-CM | POA: Diagnosis not present

## 2022-06-18 DIAGNOSIS — E782 Mixed hyperlipidemia: Secondary | ICD-10-CM | POA: Diagnosis not present

## 2022-06-18 DIAGNOSIS — R7303 Prediabetes: Secondary | ICD-10-CM | POA: Diagnosis not present

## 2022-07-24 DIAGNOSIS — Z4689 Encounter for fitting and adjustment of other specified devices: Secondary | ICD-10-CM | POA: Diagnosis not present

## 2022-07-24 DIAGNOSIS — N814 Uterovaginal prolapse, unspecified: Secondary | ICD-10-CM | POA: Diagnosis not present

## 2022-08-02 DIAGNOSIS — Z23 Encounter for immunization: Secondary | ICD-10-CM | POA: Diagnosis not present

## 2022-12-10 DIAGNOSIS — I10 Essential (primary) hypertension: Secondary | ICD-10-CM | POA: Diagnosis not present

## 2022-12-10 DIAGNOSIS — R7303 Prediabetes: Secondary | ICD-10-CM | POA: Diagnosis not present

## 2022-12-10 DIAGNOSIS — E782 Mixed hyperlipidemia: Secondary | ICD-10-CM | POA: Diagnosis not present

## 2022-12-17 DIAGNOSIS — E782 Mixed hyperlipidemia: Secondary | ICD-10-CM | POA: Diagnosis not present

## 2022-12-17 DIAGNOSIS — I10 Essential (primary) hypertension: Secondary | ICD-10-CM | POA: Diagnosis not present

## 2022-12-17 DIAGNOSIS — Z6821 Body mass index (BMI) 21.0-21.9, adult: Secondary | ICD-10-CM | POA: Diagnosis not present

## 2022-12-17 DIAGNOSIS — R7303 Prediabetes: Secondary | ICD-10-CM | POA: Diagnosis not present

## 2022-12-17 DIAGNOSIS — E870 Hyperosmolality and hypernatremia: Secondary | ICD-10-CM | POA: Diagnosis not present

## 2023-01-09 DIAGNOSIS — I10 Essential (primary) hypertension: Secondary | ICD-10-CM | POA: Diagnosis not present

## 2023-01-14 DIAGNOSIS — Z1389 Encounter for screening for other disorder: Secondary | ICD-10-CM | POA: Diagnosis not present

## 2023-01-14 DIAGNOSIS — Z0189 Encounter for other specified special examinations: Secondary | ICD-10-CM | POA: Diagnosis not present

## 2023-01-14 DIAGNOSIS — Z0000 Encounter for general adult medical examination without abnormal findings: Secondary | ICD-10-CM | POA: Diagnosis not present

## 2023-01-14 DIAGNOSIS — E870 Hyperosmolality and hypernatremia: Secondary | ICD-10-CM | POA: Diagnosis not present

## 2023-01-14 DIAGNOSIS — Z6822 Body mass index (BMI) 22.0-22.9, adult: Secondary | ICD-10-CM | POA: Diagnosis not present

## 2023-01-14 DIAGNOSIS — Z1331 Encounter for screening for depression: Secondary | ICD-10-CM | POA: Diagnosis not present

## 2023-01-14 DIAGNOSIS — Z136 Encounter for screening for cardiovascular disorders: Secondary | ICD-10-CM | POA: Diagnosis not present

## 2023-01-14 DIAGNOSIS — Z1339 Encounter for screening examination for other mental health and behavioral disorders: Secondary | ICD-10-CM | POA: Diagnosis not present

## 2023-01-14 DIAGNOSIS — Z139 Encounter for screening, unspecified: Secondary | ICD-10-CM | POA: Diagnosis not present

## 2023-01-14 DIAGNOSIS — H6122 Impacted cerumen, left ear: Secondary | ICD-10-CM | POA: Diagnosis not present

## 2023-01-14 DIAGNOSIS — H608X1 Other otitis externa, right ear: Secondary | ICD-10-CM | POA: Diagnosis not present

## 2023-01-18 DIAGNOSIS — I10 Essential (primary) hypertension: Secondary | ICD-10-CM | POA: Diagnosis not present

## 2023-01-18 DIAGNOSIS — Z6823 Body mass index (BMI) 23.0-23.9, adult: Secondary | ICD-10-CM | POA: Diagnosis not present

## 2023-01-18 DIAGNOSIS — H6090 Unspecified otitis externa, unspecified ear: Secondary | ICD-10-CM | POA: Diagnosis not present

## 2023-03-01 DIAGNOSIS — E2839 Other primary ovarian failure: Secondary | ICD-10-CM | POA: Diagnosis not present

## 2023-03-01 DIAGNOSIS — M8580 Other specified disorders of bone density and structure, unspecified site: Secondary | ICD-10-CM | POA: Diagnosis not present

## 2023-03-01 DIAGNOSIS — M85831 Other specified disorders of bone density and structure, right forearm: Secondary | ICD-10-CM | POA: Diagnosis not present

## 2023-04-08 DIAGNOSIS — Z4689 Encounter for fitting and adjustment of other specified devices: Secondary | ICD-10-CM | POA: Diagnosis not present

## 2023-06-03 DIAGNOSIS — N813 Complete uterovaginal prolapse: Secondary | ICD-10-CM | POA: Diagnosis not present

## 2023-06-03 DIAGNOSIS — Z4689 Encounter for fitting and adjustment of other specified devices: Secondary | ICD-10-CM | POA: Diagnosis not present

## 2023-06-11 DIAGNOSIS — L578 Other skin changes due to chronic exposure to nonionizing radiation: Secondary | ICD-10-CM | POA: Diagnosis not present

## 2023-06-11 DIAGNOSIS — T148XXA Other injury of unspecified body region, initial encounter: Secondary | ICD-10-CM | POA: Diagnosis not present

## 2023-06-11 DIAGNOSIS — L821 Other seborrheic keratosis: Secondary | ICD-10-CM | POA: Diagnosis not present

## 2023-06-12 DIAGNOSIS — I10 Essential (primary) hypertension: Secondary | ICD-10-CM | POA: Diagnosis not present

## 2023-06-12 DIAGNOSIS — R7303 Prediabetes: Secondary | ICD-10-CM | POA: Diagnosis not present

## 2023-06-12 DIAGNOSIS — E782 Mixed hyperlipidemia: Secondary | ICD-10-CM | POA: Diagnosis not present

## 2023-06-17 DIAGNOSIS — I10 Essential (primary) hypertension: Secondary | ICD-10-CM | POA: Diagnosis not present

## 2023-06-17 DIAGNOSIS — R7303 Prediabetes: Secondary | ICD-10-CM | POA: Diagnosis not present

## 2023-06-17 DIAGNOSIS — Z6823 Body mass index (BMI) 23.0-23.9, adult: Secondary | ICD-10-CM | POA: Diagnosis not present

## 2023-06-17 DIAGNOSIS — F4329 Adjustment disorder with other symptoms: Secondary | ICD-10-CM | POA: Diagnosis not present

## 2023-07-25 DIAGNOSIS — Z23 Encounter for immunization: Secondary | ICD-10-CM | POA: Diagnosis not present

## 2023-12-09 DIAGNOSIS — E782 Mixed hyperlipidemia: Secondary | ICD-10-CM | POA: Diagnosis not present

## 2023-12-09 DIAGNOSIS — R7303 Prediabetes: Secondary | ICD-10-CM | POA: Diagnosis not present

## 2023-12-09 DIAGNOSIS — I10 Essential (primary) hypertension: Secondary | ICD-10-CM | POA: Diagnosis not present

## 2023-12-16 DIAGNOSIS — Z6823 Body mass index (BMI) 23.0-23.9, adult: Secondary | ICD-10-CM | POA: Diagnosis not present

## 2023-12-16 DIAGNOSIS — I10 Essential (primary) hypertension: Secondary | ICD-10-CM | POA: Diagnosis not present

## 2023-12-16 DIAGNOSIS — R7303 Prediabetes: Secondary | ICD-10-CM | POA: Diagnosis not present

## 2024-02-24 DIAGNOSIS — N813 Complete uterovaginal prolapse: Secondary | ICD-10-CM | POA: Diagnosis not present

## 2024-02-24 DIAGNOSIS — Z4689 Encounter for fitting and adjustment of other specified devices: Secondary | ICD-10-CM | POA: Diagnosis not present

## 2024-05-18 DIAGNOSIS — I10 Essential (primary) hypertension: Secondary | ICD-10-CM | POA: Diagnosis not present

## 2024-05-18 DIAGNOSIS — R7303 Prediabetes: Secondary | ICD-10-CM | POA: Diagnosis not present

## 2024-05-18 DIAGNOSIS — E782 Mixed hyperlipidemia: Secondary | ICD-10-CM | POA: Diagnosis not present

## 2024-05-29 DIAGNOSIS — Z1339 Encounter for screening examination for other mental health and behavioral disorders: Secondary | ICD-10-CM | POA: Diagnosis not present

## 2024-05-29 DIAGNOSIS — R7303 Prediabetes: Secondary | ICD-10-CM | POA: Diagnosis not present

## 2024-05-29 DIAGNOSIS — E782 Mixed hyperlipidemia: Secondary | ICD-10-CM | POA: Diagnosis not present

## 2024-05-29 DIAGNOSIS — Z6823 Body mass index (BMI) 23.0-23.9, adult: Secondary | ICD-10-CM | POA: Diagnosis not present

## 2024-05-29 DIAGNOSIS — Z1331 Encounter for screening for depression: Secondary | ICD-10-CM | POA: Diagnosis not present

## 2024-05-29 DIAGNOSIS — Z136 Encounter for screening for cardiovascular disorders: Secondary | ICD-10-CM | POA: Diagnosis not present

## 2024-05-29 DIAGNOSIS — Z139 Encounter for screening, unspecified: Secondary | ICD-10-CM | POA: Diagnosis not present

## 2024-05-29 DIAGNOSIS — Z0000 Encounter for general adult medical examination without abnormal findings: Secondary | ICD-10-CM | POA: Diagnosis not present

## 2024-05-29 DIAGNOSIS — I10 Essential (primary) hypertension: Secondary | ICD-10-CM | POA: Diagnosis not present

## 2024-05-29 DIAGNOSIS — Z1389 Encounter for screening for other disorder: Secondary | ICD-10-CM | POA: Diagnosis not present

## 2024-06-15 DIAGNOSIS — L821 Other seborrheic keratosis: Secondary | ICD-10-CM | POA: Diagnosis not present

## 2024-06-15 DIAGNOSIS — L578 Other skin changes due to chronic exposure to nonionizing radiation: Secondary | ICD-10-CM | POA: Diagnosis not present

## 2024-06-15 DIAGNOSIS — L570 Actinic keratosis: Secondary | ICD-10-CM | POA: Diagnosis not present

## 2024-07-20 DIAGNOSIS — Z23 Encounter for immunization: Secondary | ICD-10-CM | POA: Diagnosis not present

## 2024-10-05 DIAGNOSIS — H524 Presbyopia: Secondary | ICD-10-CM | POA: Diagnosis not present

## 2024-10-05 DIAGNOSIS — H52223 Regular astigmatism, bilateral: Secondary | ICD-10-CM | POA: Diagnosis not present

## 2024-10-05 DIAGNOSIS — H43813 Vitreous degeneration, bilateral: Secondary | ICD-10-CM | POA: Diagnosis not present

## 2024-10-05 DIAGNOSIS — H5203 Hypermetropia, bilateral: Secondary | ICD-10-CM | POA: Diagnosis not present
